# Patient Record
Sex: Female | Born: 1990 | Race: Black or African American | Hispanic: No | Marital: Single | Smoking: Never smoker
Health system: Southern US, Community
[De-identification: ages and names within clinical notes are randomized; demographics above are authoritative.]

## PROBLEM LIST (undated history)

## (undated) DIAGNOSIS — Z789 Other specified health status: Secondary | ICD-10-CM

## (undated) HISTORY — PX: PELVIC LAPAROSCOPY: SHX162

## (undated) HISTORY — PX: NO PAST SURGERIES: SHX2092

---

## 2010-02-16 ENCOUNTER — Emergency Department (HOSPITAL_COMMUNITY): Admission: EM | Admit: 2010-02-16 | Discharge: 2010-02-16 | Payer: Self-pay | Admitting: Emergency Medicine

## 2011-12-27 ENCOUNTER — Ambulatory Visit: Payer: Federal, State, Local not specified - PPO

## 2015-10-20 ENCOUNTER — Encounter (HOSPITAL_COMMUNITY): Admission: AD | Disposition: A | Discharge status: Home / Self Care | Payer: Self-pay | Source: Ambulatory Visit

## 2015-10-20 ENCOUNTER — Inpatient Hospital Stay (HOSPITAL_COMMUNITY): Payer: Federal, State, Local not specified - PPO | Admitting: Anesthesiology

## 2015-10-20 ENCOUNTER — Encounter (HOSPITAL_COMMUNITY): Payer: Self-pay | Admitting: *Deleted

## 2015-10-20 ENCOUNTER — Inpatient Hospital Stay (HOSPITAL_COMMUNITY): Payer: Federal, State, Local not specified - PPO

## 2015-10-20 ENCOUNTER — Inpatient Hospital Stay (HOSPITAL_COMMUNITY)
Admission: AD | Admit: 2015-10-20 | Discharge: 2015-10-24 | DRG: 982 | Disposition: A | Discharge status: Home / Self Care | Payer: Federal, State, Local not specified - PPO | Source: Ambulatory Visit | Attending: General Surgery | Admitting: General Surgery

## 2015-10-20 DIAGNOSIS — R102 Pelvic and perineal pain unspecified side: Secondary | ICD-10-CM

## 2015-10-20 DIAGNOSIS — R000 Tachycardia, unspecified: Secondary | ICD-10-CM | POA: Diagnosis present

## 2015-10-20 DIAGNOSIS — R1011 Right upper quadrant pain: Secondary | ICD-10-CM | POA: Diagnosis present

## 2015-10-20 DIAGNOSIS — N83201 Unspecified ovarian cyst, right side: Secondary | ICD-10-CM | POA: Diagnosis present

## 2015-10-20 DIAGNOSIS — K661 Hemoperitoneum: Secondary | ICD-10-CM | POA: Diagnosis present

## 2015-10-20 DIAGNOSIS — R188 Other ascites: Secondary | ICD-10-CM | POA: Diagnosis present

## 2015-10-20 DIAGNOSIS — R55 Syncope and collapse: Secondary | ICD-10-CM | POA: Diagnosis present

## 2015-10-20 HISTORY — PX: LAPAROSCOPY: SHX197

## 2015-10-20 HISTORY — DX: Other specified health status: Z78.9

## 2015-10-20 LAB — CBC WITH DIFFERENTIAL/PLATELET
Basophils Absolute: 0 10*3/uL (ref 0.0–0.1)
Basophils Relative: 0 %
EOS ABS: 0 10*3/uL (ref 0.0–0.7)
Eosinophils Relative: 0 %
HCT: 28.1 % — ABNORMAL LOW (ref 36.0–46.0)
HEMOGLOBIN: 9.5 g/dL — AB (ref 12.0–15.0)
LYMPHS PCT: 11 %
Lymphs Abs: 1.4 10*3/uL (ref 0.7–4.0)
MCH: 25.9 pg — AB (ref 26.0–34.0)
MCHC: 33.8 g/dL (ref 30.0–36.0)
MCV: 76.6 fL — ABNORMAL LOW (ref 78.0–100.0)
Monocytes Absolute: 0.3 10*3/uL (ref 0.1–1.0)
Monocytes Relative: 3 %
NEUTROS ABS: 10.8 10*3/uL — AB (ref 1.7–7.7)
Neutrophils Relative %: 86 %
Platelets: 273 10*3/uL (ref 150–400)
RBC: 3.67 MIL/uL — AB (ref 3.87–5.11)
RDW: 15 % (ref 11.5–15.5)
WBC: 12.6 10*3/uL — ABNORMAL HIGH (ref 4.0–10.5)

## 2015-10-20 LAB — PROTIME-INR
INR: 1.2 (ref 0.00–1.49)
Prothrombin Time: 15.4 seconds — ABNORMAL HIGH (ref 11.6–15.2)

## 2015-10-20 LAB — HEMOGLOBIN AND HEMATOCRIT, BLOOD
HEMATOCRIT: 24 % — AB (ref 36.0–46.0)
HEMOGLOBIN: 8.1 g/dL — AB (ref 12.0–15.0)

## 2015-10-20 LAB — APTT: aPTT: 28 seconds (ref 24–37)

## 2015-10-20 LAB — POCT PREGNANCY, URINE: Preg Test, Ur: NEGATIVE

## 2015-10-20 LAB — PREPARE RBC (CROSSMATCH)

## 2015-10-20 LAB — ABO/RH: ABO/RH(D): O POS

## 2015-10-20 SURGERY — LAPAROSCOPY, DIAGNOSTIC
Anesthesia: General | Site: Abdomen

## 2015-10-20 MED ORDER — FENTANYL CITRATE (PF) 100 MCG/2ML IJ SOLN
INTRAMUSCULAR | Status: DC | PRN
  Administered 2015-10-20: 50 ug via INTRAVENOUS
  Administered 2015-10-20 (×2): 100 ug via INTRAVENOUS
  Administered 2015-10-20: 50 ug via INTRAVENOUS

## 2015-10-20 MED ORDER — DEXAMETHASONE SODIUM PHOSPHATE 4 MG/ML IJ SOLN
INTRAMUSCULAR | Status: AC
  Filled 2015-10-20: qty 1

## 2015-10-20 MED ORDER — PANTOPRAZOLE SODIUM 40 MG IV SOLR
40.0000 mg | Freq: Every day | INTRAVENOUS | Status: DC
  Administered 2015-10-20 – 2015-10-22 (×3): 40 mg via INTRAVENOUS
  Filled 2015-10-20 (×3): qty 40

## 2015-10-20 MED ORDER — LACTATED RINGERS IR SOLN
Status: DC | PRN
  Administered 2015-10-20: 3000 mL

## 2015-10-20 MED ORDER — LACTATED RINGERS IV SOLN
INTRAVENOUS | Status: DC | PRN
  Administered 2015-10-20 (×3): via INTRAVENOUS

## 2015-10-20 MED ORDER — GLYCOPYRROLATE 0.2 MG/ML IJ SOLN
INTRAMUSCULAR | Status: DC | PRN
  Administered 2015-10-20: 0.6 mg via INTRAVENOUS

## 2015-10-20 MED ORDER — ROCURONIUM BROMIDE 100 MG/10ML IV SOLN
INTRAVENOUS | Status: AC
  Filled 2015-10-20: qty 1

## 2015-10-20 MED ORDER — MORPHINE SULFATE (PF) 4 MG/ML IV SOLN
2.0000 mg | INTRAVENOUS | Status: DC | PRN
  Administered 2015-10-21: 2 mg via INTRAVENOUS
  Filled 2015-10-20: qty 1

## 2015-10-20 MED ORDER — ROCURONIUM BROMIDE 100 MG/10ML IV SOLN
INTRAVENOUS | Status: DC | PRN
  Administered 2015-10-20: 10 mg via INTRAVENOUS
  Administered 2015-10-20: 30 mg via INTRAVENOUS

## 2015-10-20 MED ORDER — HYDROMORPHONE HCL 1 MG/ML IJ SOLN
INTRAMUSCULAR | Status: AC
  Filled 2015-10-20: qty 1

## 2015-10-20 MED ORDER — LIDOCAINE HCL (CARDIAC) 20 MG/ML IV SOLN
INTRAVENOUS | Status: DC | PRN
  Administered 2015-10-20: 50 mg via INTRAVENOUS

## 2015-10-20 MED ORDER — HYDROMORPHONE HCL 2 MG/ML IJ SOLN: 2.0000 mg | Freq: Once | INTRAMUSCULAR | Status: DC

## 2015-10-20 MED ORDER — HYDROMORPHONE HCL 1 MG/ML IJ SOLN
1.0000 mg | INTRAMUSCULAR | Status: DC | PRN
  Administered 2015-10-20 – 2015-10-22 (×8): 1 mg via INTRAVENOUS
  Filled 2015-10-20: qty 2
  Filled 2015-10-20 (×8): qty 1

## 2015-10-20 MED ORDER — ONDANSETRON 4 MG PO TBDP
4.0000 mg | ORAL_TABLET | Freq: Four times a day (QID) | ORAL | Status: DC | PRN
  Filled 2015-10-20: qty 1

## 2015-10-20 MED ORDER — ONDANSETRON HCL 4 MG/2ML IJ SOLN
4.0000 mg | Freq: Four times a day (QID) | INTRAMUSCULAR | Status: DC | PRN
  Administered 2015-10-21 – 2015-10-22 (×3): 4 mg via INTRAVENOUS
  Filled 2015-10-20 (×3): qty 2

## 2015-10-20 MED ORDER — BUPIVACAINE HCL (PF) 0.25 % IJ SOLN
INTRAMUSCULAR | Status: DC | PRN
  Administered 2015-10-20: 4 mL

## 2015-10-20 MED ORDER — PROPOFOL 10 MG/ML IV BOLUS
INTRAVENOUS | Status: DC | PRN
  Administered 2015-10-20: 150 mg via INTRAVENOUS

## 2015-10-20 MED ORDER — HYDROMORPHONE HCL 1 MG/ML IJ SOLN
0.2500 mg | INTRAMUSCULAR | Status: DC | PRN
  Administered 2015-10-20: 0.5 mg via INTRAVENOUS

## 2015-10-20 MED ORDER — CITRIC ACID-SODIUM CITRATE 334-500 MG/5ML PO SOLN: 30.0000 mL | Freq: Once | ORAL | Status: DC

## 2015-10-20 MED ORDER — FENTANYL CITRATE (PF) 100 MCG/2ML IJ SOLN
INTRAMUSCULAR | Status: AC
  Filled 2015-10-20: qty 2

## 2015-10-20 MED ORDER — SUCCINYLCHOLINE CHLORIDE 20 MG/ML IJ SOLN
INTRAMUSCULAR | Status: DC | PRN
  Administered 2015-10-20: 100 mg via INTRAVENOUS

## 2015-10-20 MED ORDER — KCL IN DEXTROSE-NACL 20-5-0.9 MEQ/L-%-% IV SOLN
INTRAVENOUS | Status: DC
  Administered 2015-10-20: 23:00:00 via INTRAVENOUS
  Administered 2015-10-21: 100 mL/h via INTRAVENOUS
  Administered 2015-10-21 – 2015-10-23 (×2): via INTRAVENOUS
  Administered 2015-10-24: 100 mL/h via INTRAVENOUS
  Filled 2015-10-20 (×9): qty 1000

## 2015-10-20 MED ORDER — FAMOTIDINE IN NACL 20-0.9 MG/50ML-% IV SOLN
20.0000 mg | Freq: Once | INTRAVENOUS | Status: AC
  Administered 2015-10-20: 20 mg via INTRAVENOUS
  Filled 2015-10-20: qty 50

## 2015-10-20 MED ORDER — ONDANSETRON HCL 4 MG/2ML IJ SOLN
INTRAMUSCULAR | Status: DC | PRN
  Administered 2015-10-20: 4 mg via INTRAVENOUS

## 2015-10-20 MED ORDER — FENTANYL CITRATE (PF) 250 MCG/5ML IJ SOLN
INTRAMUSCULAR | Status: AC
  Filled 2015-10-20: qty 5

## 2015-10-20 MED ORDER — SODIUM CHLORIDE 0.9 % IV BOLUS (SEPSIS)
1000.0000 mL | Freq: Once | INTRAVENOUS | Status: AC
  Administered 2015-10-20: 1000 mL via INTRAVENOUS

## 2015-10-20 MED ORDER — NEOSTIGMINE METHYLSULFATE 10 MG/10ML IV SOLN
INTRAVENOUS | Status: DC | PRN
  Administered 2015-10-20: 3 mg via INTRAVENOUS

## 2015-10-20 MED ORDER — MIDAZOLAM HCL 2 MG/2ML IJ SOLN
INTRAMUSCULAR | Status: AC
  Filled 2015-10-20: qty 2

## 2015-10-20 MED ORDER — ONDANSETRON HCL 4 MG/2ML IJ SOLN
INTRAMUSCULAR | Status: AC
  Filled 2015-10-20: qty 2

## 2015-10-20 MED ORDER — LIDOCAINE HCL (CARDIAC) 20 MG/ML IV SOLN
INTRAVENOUS | Status: AC
  Filled 2015-10-20: qty 5

## 2015-10-20 MED ORDER — PROPOFOL 10 MG/ML IV BOLUS
INTRAVENOUS | Status: AC
  Filled 2015-10-20: qty 20

## 2015-10-20 MED ORDER — MIDAZOLAM HCL 2 MG/2ML IJ SOLN
INTRAMUSCULAR | Status: DC | PRN
  Administered 2015-10-20: 2 mg via INTRAVENOUS

## 2015-10-20 MED ORDER — DEXAMETHASONE SODIUM PHOSPHATE 10 MG/ML IJ SOLN
INTRAMUSCULAR | Status: DC | PRN
  Administered 2015-10-20: 4 mg via INTRAVENOUS

## 2015-10-20 MED ORDER — SUCCINYLCHOLINE CHLORIDE 20 MG/ML IJ SOLN
INTRAMUSCULAR | Status: AC
  Filled 2015-10-20: qty 1

## 2015-10-20 SURGICAL SUPPLY — 29 items
CABLE HIGH FREQUENCY MONO STRZ (ELECTRODE) IMPLANT
CANISTER SUCT 3000ML (MISCELLANEOUS) ×2 IMPLANT
CHLORAPREP W/TINT 26ML (MISCELLANEOUS) IMPLANT
CLOTH BEACON ORANGE TIMEOUT ST (SAFETY) ×2 IMPLANT
DISSECTOR BLUNT TIP ENDO 5MM (MISCELLANEOUS) IMPLANT
DRSG COVADERM PLUS 2X2 (GAUZE/BANDAGES/DRESSINGS) ×2 IMPLANT
DRSG OPSITE POSTOP 3X4 (GAUZE/BANDAGES/DRESSINGS) IMPLANT
DURAPREP 26ML APPLICATOR (WOUND CARE) ×2 IMPLANT
GLOVE BIO SURGEON STRL SZ 6.5 (GLOVE) ×4 IMPLANT
GLOVE BIOGEL PI IND STRL 7.0 (GLOVE) ×2 IMPLANT
GLOVE BIOGEL PI INDICATOR 7.0 (GLOVE) ×2
GOWN STRL REUS W/TWL LRG LVL3 (GOWN DISPOSABLE) ×10 IMPLANT
LIQUID BAND (GAUZE/BANDAGES/DRESSINGS) ×2 IMPLANT
NEEDLE INSUFFLATION 120MM (ENDOMECHANICALS) ×2 IMPLANT
NS IRRIG 1000ML POUR BTL (IV SOLUTION) ×2 IMPLANT
PACK LAPAROSCOPY BASIN (CUSTOM PROCEDURE TRAY) ×2 IMPLANT
PAD TRENDELENBURG POSITION (MISCELLANEOUS) ×2 IMPLANT
SCISSORS LAP 5X35 DISP (ENDOMECHANICALS) IMPLANT
SET CYSTO W/LG BORE CLAMP LF (SET/KITS/TRAYS/PACK) IMPLANT
SET IRRIG TUBING LAPAROSCOPIC (IRRIGATION / IRRIGATOR) ×2 IMPLANT
SHEARS HARMONIC ACE PLUS 36CM (ENDOMECHANICALS) IMPLANT
SLEEVE XCEL OPT CAN 5 100 (ENDOMECHANICALS) ×2 IMPLANT
SUT VICRYL 4-0 PS2 18IN ABS (SUTURE) ×2 IMPLANT
SYSTEM CARTER THOMASON II (TROCAR) IMPLANT
TOWEL OR 17X24 6PK STRL BLUE (TOWEL DISPOSABLE) ×4 IMPLANT
TRAY FOLEY CATH SILVER 14FR (SET/KITS/TRAYS/PACK) ×2 IMPLANT
TROCAR XCEL DIL TIP R 11M (ENDOMECHANICALS) IMPLANT
TROCAR XCEL NON-BLD 5MMX100MML (ENDOMECHANICALS) ×2 IMPLANT
WARMER LAPAROSCOPE (MISCELLANEOUS) ×2 IMPLANT

## 2015-10-20 NOTE — Anesthesia Postprocedure Evaluation (Signed)
Anesthesia Post Note  Patient: Kathryn [name]  Procedure(s) Performed: Procedure(s) (LRB): LAPAROSCOPY DIAGNOSTIC, DRAINAGE OF RIGHT OVARIAN CYST (N/A)  Patient location during evaluation: PACU Anesthesia Type: General Level of consciousness: awake and alert Pain management: pain level controlled Vital Signs Assessment: post-procedure vital signs reviewed and stable Respiratory status: spontaneous breathing, nonlabored ventilation, respiratory function stable and patient connected to nasal cannula oxygen Cardiovascular status: blood pressure returned to baseline and stable Postop Assessment: no signs of nausea or vomiting Anesthetic complications: no    Last Vitals:  Filed Vitals:   10/20/15 2045 10/20/15 2100  BP: 106/68 102/76  Pulse: 83 92  Temp:    Resp: 14 13    Last Pain:  Filed Vitals:   10/20/15 2108  PainSc: 1                  Phillips Groutarignan, Renie Stelmach

## 2015-10-20 NOTE — Op Note (Signed)
Preoperative Diagnosis: Abdominal pain, hemoperitoneum, right ovarian cyst  Postoperative Diagnosis: same  Procedure: Diagnostic laparoscopy, drainage of right ovarian cyst, removal of hemoperitoneum  Surgeon: Dr Gertie ExonJill Jertson  Assistant: Dr Leda QuailSuzanne Miller  Intraoperative Consultation: Dr Johna SheriffHoxworth  Anesthesia: General  EBL: 900 cc   Fluids: 1,000 cc LR  Urine output: 300 cc  Complications: none  Indications for surgery: The patient is a 25 year old female, who presented with abdominal pain. Work up included a RUQ ultrasound that showed fluid around her liver, pelvic ultrasound showed a 4+ cm simple right ovarian cyst, complex fluid in her pelvis, hgb of 9.5. The patient was tachycardic, stable BP. Negative pregnancy test. The patient is aware of the risks and complications involved with the surgery and consent was obtained prior to the procedure.  Findings: Normal uterus, normal tubes, normal left ovary, right ovary with 4+ cm simple cyst. Normal appendix, normal liver edge, normal spleen. No source of bleeding was found.   Procedure: The patient was taken to the operating room with an IV in place. She was placed in the dorsal lithotomy position. General anesthesia was administered. She was prepped and draped in the usual sterile fashion for an abdominal, vaginal surgery. A hulka tenaculum was placed in the uterus and a foley catheter was placed in her bladder.  The umbilicus was everted, injected with 0.25% marcaine and incised with a # 11 blade. 2 towel clips were used to elevated the umbilicus and a veress needle was placed into the abdominal cavity. The abdominal cavity was insufflated with CO2, with normal intraabdominal pressures. After adequate pneumo-insufflation the veress needle was removed and the 5 mm laparoscope was placed into the abdominal cavity using the opti-view trocar. Blood was noted filling the pelvis and the upper abdomen. The patient was placed in trendelenburg and  the abdominal pelvic cavity was inspected. 1 more trocars were placed in the right lower quadrant approximately 3 cm medial to the anterior superior iliac spine. This area was injected with 0.25% marcaine, incised with a #11 blade and a 5 mm trocars was inserted with direct visualization with the laparoscope.  The blood was suctioned out of the abdomen and pelvis, the clot was broken up and removed. The abdominal pelvic cavity was carefully inspected. No clear source of bleeding was found. The right ovarian cyst was drained of clear fluid. The edge of the ovary bleed slightly after being drained and was cauterized. Hemostasis was excellent.  After suctioning the blood it did appear to well up slightly in the upper abdomen. An intraoperative consult was obtained with Dr Johna SheriffHoxworth in General Surgery. Please see his separate operative report.   After Dr Johna SheriffHoxworth was done with his portion of the surgery the abdomen was desulfated and the trocars were removed.   The skin was closed with dermabond. The hulka tenaculum was removed. The foley was left in place.   The patient's abdomen and perineum were cleansed and she was taken out of the dorsal lithotomy position. Upon awakening she was extubated and taken to the recovery room in stable condition. The sponge and instrument counts were correct.   [facility] for further evaluation and observation.

## 2015-10-20 NOTE — Transfer of Care (Signed)
Immediate Anesthesia Transfer of Care Note  Patient: Kathryn [name]  Procedure(s) Performed: Procedure(s): LAPAROSCOPY DIAGNOSTIC, DRAINAGE OF RIGHT OVARIAN CYST (N/A)  Patient Location: PACU  Anesthesia Type:General  Level of Consciousness: awake, alert , oriented and patient cooperative  Airway & Oxygen Therapy: Patient Spontanous Breathing and Patient connected to nasal cannula oxygen  Post-op Assessment: Report given to RN and Post -op Vital signs reviewed and stable  Post vital signs: Reviewed and stable  Last Vitals: TEMP 99.5 BP 112/77 HR 77 RR 12 POX 100   Complications: No apparent anesthesia complications

## 2015-10-20 NOTE — MAU Note (Signed)
States increased pain to right mid. Abdomen this am around 0500 causing to pass out.  Presents with increased abd,. Pain.  Upon inspection in the lobby, pt. Appeared that she was passing out.  Amonina used and pt. Responded and escorted to room via wheel chair.

## 2015-10-20 NOTE — MAU Note (Signed)
URINE IN LAB 

## 2015-10-20 NOTE — H&P (Signed)
GYNECOLOGY  VISIT   HPI: 25 y.o.   Single  Mediterranean  female   G0P0000 with Patient's last menstrual period was 09/21/2015.   The patient c/o BLQ abdominal pain that started at 1 am and progressively worsened. Over several hours the pain went up to her RUQ. The pain is constant, a "20/10" in severity. S/P Dilaudid the pain is a "3/10" in severity. She presented to Dr Yetta BarreJones this morning c/o RUQ pain. She had blood work and a RUQ ultrasound. She was noted to have intraabdominal fluid in BLQ and RUQ. The patient was sent here for further evaluation. She c/o syncope x 1 this morning. No fevers, nausea, emesis, diarrhea, or urinary c/o.   GYNECOLOGIC HISTORY: Patient's last menstrual period was 09/21/2015. Contraception: condoms Menopausal hormone therapy: NA        OB History    Gravida Para Term Preterm AB TAB SAB Ectopic Multiple Living   0 0 0 0 0 0 0 0 0 0          There are no active problems to display for this patient.   Past Medical History  Diagnosis Date  . Medical history non-contributory     Past Surgical History  Procedure Laterality Date  . No past surgeries      Current Facility-Administered Medications  Medication Dose Route Frequency Provider Last Rate Last Dose  . HYDROmorphone (DILAUDID) injection 1-2 mg  1-2 mg Intravenous Q3H PRN Dorathy KinsmanVirginia Smith, CNM   1 mg at 10/20/15 1532     ALLERGIES: Review of patient's allergies indicates no known allergies.  History reviewed. No pertinent family history.  Social History   Social History  . Marital Status: Single    Spouse Name: N/A  . Number of Children: N/A  . Years of Education: N/A   Occupational History  . Not on file.   Social History Main Topics  . Smoking status: Never Smoker   . Smokeless tobacco: Never Used  . Alcohol Use: No  . Drug Use: No  . Sexual Activity: Yes    Birth Control/ Protection: Condom   Other Topics Concern  . Not on file   Social History Narrative  . No narrative on  file    Review of Systems  HENT: Negative.   Eyes: Negative.   Respiratory: Negative.   Cardiovascular: Negative.   Gastrointestinal: Positive for vomiting and abdominal pain. Negative for heartburn, nausea, diarrhea and constipation.  Genitourinary: Negative.   Musculoskeletal: Negative.   Skin: Negative.   Neurological: Positive for weakness.  Endo/Heme/Allergies: Negative.   Psychiatric/Behavioral: Negative.       PHYSICAL EXAMINATION:    BP 114/70 mmHg  Pulse 116  Temp(Src) 97.7 F (36.5 C) (Oral)  Resp 20  Ht 5' 0.5" (1.537 m)  Wt 114 lb 12.8 oz (52.073 kg)  BMI 22.04 kg/m2  SpO2 100%  LMP 09/21/2015     General appearance: alert, cooperative and appears stated age Neck: no adenopathy, supple, symmetrical, trachea midline and thyroid  Heart: tachycardic, regular rhythm, no murmur Lungs: CTAB Abdomen: soft, tender RLQ, no rebound, no guarding, distended. Extremities: normal, atraumatic, no cyanosis Skin: normal color, texture and turgor, no rashes or lesions Neurologic: grossly normal  CBC    Component Value Date/Time   WBC 12.6* 10/20/2015 1417   RBC 3.67* 10/20/2015 1417   HGB 9.5* 10/20/2015 1417   HCT 28.1* 10/20/2015 1417   PLT 273 10/20/2015 1417   MCV 76.6* 10/20/2015 1417   MCH 25.9* 10/20/2015 1417  MCHC 33.8 10/20/2015 1417   RDW 15.0 10/20/2015 1417   LYMPHSABS 1.4 10/20/2015 1417   MONOABS 0.3 10/20/2015 1417   EOSABS 0.0 10/20/2015 1417   BASOSABS 0.0 10/20/2015 1417   upt negative  EXAM: TRANSABDOMINAL AND TRANSVAGINAL ULTRASOUND OF PELVIS  DOPPLER ULTRASOUND OF OVARIES  TECHNIQUE: Both transabdominal and transvaginal ultrasound examinations of the pelvis were performed. Transabdominal technique was performed for global imaging of the pelvis including uterus, ovaries, adnexal regions, and pelvic cul-de-sac.  It was necessary to proceed with endovaginal exam following the transabdominal exam to visualize the ovaries. Color  and duplex Doppler ultrasound was utilized to evaluate blood flow to the ovaries.  COMPARISON: None.  FINDINGS: Uterus  Measurements: 9.5 x 6.3 x 4.7 cm. No fibroids or other mass visualized.  Endometrium  Thickness: 15 mm which is within normal limits for patient of reproductive age. No focal abnormality visualized.  Right ovary  Measurements: 6.9 x 5.7 x 5.1 cm. 4.5 cm simple cyst is noted.  Left ovary  Measurements: 4.1 x 1.9 x 1.8 cm. Normal appearance/no adnexal mass.  Pulsed Doppler evaluation of both ovaries demonstrates normal low-resistance arterial and venous waveforms.  Other findings  There is noted a moderate amount of complex free fluid in the pelvis, particularly in the right adnexal region. This is concerning for possible hemorrhage.  IMPRESSION: 4.5 cm simple right ovarian cyst is noted. There is noted a moderate amount of complex free fluid seen in the pelvis, particularly on the right adnexal region which may represent possible hemorrhage. CT scan may be performed for further evaluation. Critical Value/emergent results were called by telephone at the time of interpretation on 10/20/2015 at 4:03 pm to Dr. Dorathy Kinsman , who verbally acknowledged these results.   Electronically Signed  By: Roque Lias, MD      ASSESSMENT Abdominal pain, blood in in abdomen, simple right ovarian cyst no evidence of torsion. Hgb of 9.5, negative UPT    PLAN Discussed with the patient admission and observation vs laparoscopy Will check orthostatics now 120/78   BP- Lying                                  Pulse- Lying                             108   Pulse- Lying    BP- Sitting                             105/70   BP- Sitting    Pulse- Sitting                             112   Pulse- Sitting    BP- Standing at 0 minutes                             111/67   BP- Standing at 0 minutes    Pulse- Standing at 0 minutes                              130   Pulse- Standing at 0 minutes    BP- Standing at 3 minutes  114/72   BP- Standing at 3 minutes    Pulse- Standing at 3 minutes                             120   Pulse- Standing at 3 minutes    Oxygenation       After further discussion with the patient and family, will proceed with diagnostic laparoscopy. Risks reviewed, including: bleeding, infection, damage to bowel, bladder, vessels, ureters.    An After Visit Summary was printed and given to the patient.

## 2015-10-20 NOTE — MAU Provider Note (Signed)
Chief Complaint: Abdominal Pain   First Provider Initiated Contact with Patient 10/20/15 1418     SUBJECTIVE HPI: Kathryn [name] is a 25 y.o. G0P0000 non-pregnant female who presents to [facility] reporting severe low abd and epigastric pain since this morning. Pain was so severe that she passed out this morning for 10-15 seconds. Denies any trauma from this episiode. Had a near-syncopal episode in lobby of MAU today from a wave of pain.    Was seen at Urgent Care this morning for the pain. Abd US results below showed 4.3 cm right ovarian cyst and significant free fluid. UPT neg and CBC collected per report to pt's Gyn, Dr, Oscar LaJertson. Unable to see those result is Care Everywhere. Instructed to come to MAU for further eval.   TECHNIQUE: Real-time, multiplanar, grayscale, color Doppler ultrasound of the right upper quadrant was performed.  INDICATION: Upper quadrant pain  COMPARISON: None.  FINDINGS:  Aorta/IVC: The proximal aorta measures 1.6 cm, the mid aorta measures 1.6 cm, the distal aorta measures 1.4 cm. Normal caliber IVC.  Liver: Normal in echotexture without focal lesion or biliary ductal dilatation. The main portal vein is patent by color Doppler analysis with normal directional flow.  Gallbladder: No stones or sludge. No gallbladder wall thickening or pericholecystic fluid. Mild tenderness to palpation with the probe. The CBD measures 3 mm.  Pancreas: Mostly obscured by overlying bowel gas. The visualized portion is unremarkable.  Right kidney: The right kidney measures 11.3 cm. No solid mass, pelvocaliectasis or stones.  Incidental ascites in Morisons pouch, right side and bilateral lower quadrants. Subtle right ovarian cyst measuring 4.3 cm  IMPRESSION:   Bilateral lower quadrant and right abdominal ascites. Right ovarian cyst. Right upper quadrant tenderness during exam.  Electronically Signed by Samuel JesterLeslie Fort, MD   Location: Suprapubic, epigastric Quality:  sharp Severity: 7/10 on pain scale Duration: 10 hours Context: None Course: unchanged Timing: Intermittent Modifying factors: No improvement w/ Ibuprofen Associated signs and symptoms: Positive for syncopal episode. Negative for fever, chills, nausea, vomiting, diarrhea, constipation, urinary complaints.  Past Medical History  Diagnosis Date  . Medical history non-contributory    OB History  Gravida Para Term Preterm AB SAB TAB Ectopic Multiple Living  0 0 0 0 0 0 0 0 0 0        Past Surgical History  Procedure Laterality Date  . No past surgeries     Social History   Social History  . Marital Status: Single    Spouse Name: N/A  . Number of Children: N/A  . Years of Education: N/A   Occupational History  . Not on file.   Social History Main Topics  . Smoking status: Never Smoker   . Smokeless tobacco: Never Used  . Alcohol Use: No  . Drug Use: No  . Sexual Activity: Yes    Birth Control/ Protection: Condom   Other Topics Concern  . Not on file   Social History Narrative  . No narrative on file   No current facility-administered medications on file prior to encounter.   No current outpatient prescriptions on file prior to encounter.   No Known Allergies  I have reviewed the past Medical Hx, Surgical Hx, Social Hx, Allergies and Medications.   Review of Systems  Constitutional: Negative for fever, chills and appetite change.  Gastrointestinal: Positive for abdominal pain. Negative for nausea, vomiting, diarrhea, constipation, blood in stool and abdominal distention.  Genitourinary: Negative for dysuria, urgency, frequency, hematuria, flank pain, vaginal bleeding and  vaginal discharge.  Musculoskeletal: Negative for back pain.  Skin: Negative for pallor.  Neurological: Positive for dizziness and syncope. Negative for headaches.    OBJECTIVE Patient Vitals for the past 24 hrs:  BP Temp Temp src Pulse Resp SpO2 Height Weight  10/20/15 1700 107/63 mmHg - -  98 - - - -  10/20/15 1630 114/70 mmHg - - 116 - - - -  10/20/15 1615 (!) 93/49 mmHg - - 104 - - - -  10/20/15 1600 (!) 93/50 mmHg - - 96 - - - -  10/20/15 1546 93/58 mmHg - - 108 - - - -  10/20/15 1537 111/68 mmHg - - 97 - 100 % - -  10/20/15 1412 97/59 mmHg 97.7 F (36.5 C) Oral 79 20 97 % - -  10/20/15 1327 117/71 mmHg 99.7 F (37.6 C) Oral 108 18 100 % 5' 0.5" (1.537 m) 114 lb 12.8 oz (52.073 kg)   Constitutional: Well-developed, well-nourished female in mild-moderate distress. Mild pallor. Pt standign w/ assistance when CNM arrived in room. States dizziness was better.  Cardiovascular: Mild tachycardia Respiratory: normal rate and effort.  GI: Abd mildly-distended, severe suprapubic tenderness and moderate epigastric tenderness. Pos BS x 4 Neurologic: Alert and oriented x 4.  GU: Deferred  LAB RESULTS Results for orders placed or performed during the hospital encounter of 10/20/15 (from the past 24 hour(s))  CBC with Differential/Platelet     Status: Abnormal   Collection Time: 10/20/15  2:17 PM  Result Value Ref Range   WBC 12.6 (H) 4.0 - 10.5 K/uL   RBC 3.67 (L) 3.87 - 5.11 MIL/uL   Hemoglobin 9.5 (L) 12.0 - 15.0 g/dL   HCT 16.1 (L) 09.6 - 04.5 %   MCV 76.6 (L) 78.0 - 100.0 fL   MCH 25.9 (L) 26.0 - 34.0 pg   MCHC 33.8 30.0 - 36.0 g/dL   RDW 40.9 81.1 - 91.4 %   Platelets 273 150 - 400 K/uL   Neutrophils Relative % 86 %   Neutro Abs 10.8 (H) 1.7 - 7.7 K/uL   Lymphocytes Relative 11 %   Lymphs Abs 1.4 0.7 - 4.0 K/uL   Monocytes Relative 3 %   Monocytes Absolute 0.3 0.1 - 1.0 K/uL   Eosinophils Relative 0 %   Eosinophils Absolute 0.0 0.0 - 0.7 K/uL   Basophils Relative 0 %   Basophils Absolute 0.0 0.0 - 0.1 K/uL    IMAGING No results found.  MAU COURSE CBC, type and screen, pelvic and transvaginal ultrasound with Dopplers, nothing by mouth, saline bolus.  Brief discussion of history and results from Urgent Care this morning with Dr. Oscar La. Agrees with plan of  care.  Dr. Oscar La notified of ultrasound results. Still attempting to obtain results from urgent care. Verbally told that hemoglobin was 10.0 at visit this morning.   Care of patient turned over to Dr. Oscar La.  MDM - 25 year old non-pregnant female with right ovarian cyst and moderate hemoperitoneum of unknown cause. Concern for hemodynamic instability due to worsening tachycardia and syncopal episodes.  ASSESSMENT 1. Ovarian cyst, right   2. Acute pelvic pain, female   3. Hemoperitoneum    PLAN See Dr. Salli Quarry note.  [place], [place] 10/20/2015  5:28 PM

## 2015-10-20 NOTE — Anesthesia Procedure Notes (Signed)
Procedure Name: Intubation Date/Time: 10/20/2015 6:11 PM Performed by: Shanon PayorGREGORY, Johnthomas Lader M Pre-anesthesia Checklist: Patient identified, Emergency Drugs available, Suction available, Patient being monitored and Timeout performed Patient Re-evaluated:Patient Re-evaluated prior to inductionOxygen Delivery Method: Circle system utilized Preoxygenation: Pre-oxygenation with 100% oxygen Intubation Type: IV induction, Rapid sequence and Cricoid Pressure applied Laryngoscope Size: Mac and 3 Grade View: Grade I Tube type: Oral Tube size: 7.0 mm Number of attempts: 1 Airway Equipment and Method: Stylet Placement Confirmation: ETT inserted through vocal cords under direct vision,  positive ETCO2 and breath sounds checked- equal and bilateral Secured at: 19 cm Tube secured with: Tape Dental Injury: Teeth and Oropharynx as per pre-operative assessment

## 2015-10-20 NOTE — Consult Note (Signed)
History: I was contacted by Dr. Oscar LaJertson intraoperatively regarding this patient. She was admitted today with the following history:  The patient c/o BLQ abdominal pain that started at 1 am and progressively worsened. Over several hours the pain went up to her RUQ. The pain is constant, a "20/10" in severity. S/P Dilaudid the pain is a "3/10" in severity. She presented to Dr Yetta BarreJones this morning c/o RUQ pain. She had blood work and a RUQ ultrasound. She was noted to have intraabdominal fluid in BLQ and RUQ. The patient was sent here for further evaluation. She c/o syncope x 1 this morning. No fevers, nausea, emesis, diarrhea, or urinary c/o.   Right upper quadrant and pelvic ultrasound showed fluid in the abdomen and a probable ovarian cyst and she was taken to the operating room for laparoscopy with diagnosis of probable bleeding ruptured ovarian cyst. At the time of laparoscopy there did not appear to be active bleeding from the cyst or any evidence of rupture or previous bleeding from the cyst and the majority of the blood was in the upper abdomen. This was all suctioned and she had a total of 900 mL's of blood in the peritoneal cavity. There is no apparent history of trauma. I was asked to evaluate the patient intraoperatively. Laparoscopically the liver and spleen appeared normal. There was no evidence of retroperitoneal hematoma. Bowel loops and omentum appeared normal. Certainly no evidence of ongoing bleeding in the pelvis. There was a little bit of blood accumulating in the upper abdomen beneath both hemidiaphragms but consistent with just pulling rather than ongoing bleeding. She has been stable hemodynamically.  Physical exam not able to be performed at this time as above  Assessment and plan: Spontaneous intraperitoneal bleed a significant volume, 900 mL. No obvious source seen at laparoscopy and its the opinion of Dr. Oscar LaJertson that operative findings indicated it is highly unlikely that this was a  pelvic bleeding source. Liver appeared normal and right upper quadrant ultrasound so hemangioma or ruptured adenoma seemed very unlikely. Question trivial trauma to the spleen or congenital aneurysm but no evidence of ongoing bleeding or signs of this laparoscopically.  The patient appears stable but there is concern regarding repeat bleeding over the source of the bleeding. I therefore offered to take the [facility]. I discussed with radiology we will obtain a CT scan of the abdomen and pelvis with contrast as an initial screening. Observe closely in the ICU for now. She currently is very stable.

## 2015-10-20 NOTE — MAU Note (Signed)
Started with body aches, woke her then started having severe pain in RUQ down to RLQ and up to rt shoulder, having pain on left side but not as bad.

## 2015-10-20 NOTE — Anesthesia Preprocedure Evaluation (Addendum)
Anesthesia Evaluation    Reviewed: Allergy & Precautions, NPO status , Patient's Chart, lab work & pertinent test results  History of Anesthesia Complications Negative for: history of anesthetic complications  Airway Mallampati: II  TM Distance: >3 FB Neck ROM: Full    Dental no notable dental hx.    Pulmonary neg pulmonary ROS,    Pulmonary exam normal breath sounds clear to auscultation       Cardiovascular negative cardio ROS Normal cardiovascular exam Rhythm:Regular Rate:Normal     Neuro/Psych negative neurological ROS  negative psych ROS   GI/Hepatic negative GI ROS, Neg liver ROS,   Endo/Other  negative endocrine ROS  Renal/GU negative Renal ROS  negative genitourinary   Musculoskeletal negative musculoskeletal ROS (+)   Abdominal   Peds negative pediatric ROS (+)  Hematology  (+) anemia ,   Anesthesia Other Findings   Reproductive/Obstetrics negative OB ROS                           Anesthesia Physical Anesthesia Plan  ASA: III and emergent  Anesthesia Plan: General   Post-op Pain Management:    Induction: Intravenous, Rapid sequence and Cricoid pressure planned  Airway Management Planned: Oral ETT  Additional Equipment:   Intra-op Plan:   Post-operative Plan: Extubation in OR  Informed Consent:   Plan Discussed with:   Anesthesia Plan Comments:        Anesthesia Quick Evaluation

## 2015-10-21 ENCOUNTER — Telehealth: Payer: Self-pay | Admitting: Obstetrics and Gynecology

## 2015-10-21 ENCOUNTER — Encounter (HOSPITAL_COMMUNITY): Payer: Self-pay

## 2015-10-21 ENCOUNTER — Inpatient Hospital Stay (HOSPITAL_COMMUNITY): Payer: Federal, State, Local not specified - PPO

## 2015-10-21 LAB — CBC WITH DIFFERENTIAL/PLATELET
BASOS ABS: 0 10*3/uL (ref 0.0–0.1)
Basophils Relative: 0 %
Eosinophils Absolute: 0.1 10*3/uL (ref 0.0–0.7)
Eosinophils Relative: 1 %
HCT: 24.1 % — ABNORMAL LOW (ref 36.0–46.0)
HEMOGLOBIN: 8 g/dL — AB (ref 12.0–15.0)
LYMPHS ABS: 2 10*3/uL (ref 0.7–4.0)
LYMPHS PCT: 22 %
MCH: 26 pg (ref 26.0–34.0)
MCHC: 33.2 g/dL (ref 30.0–36.0)
MCV: 78.2 fL (ref 78.0–100.0)
Monocytes Absolute: 0.6 10*3/uL (ref 0.1–1.0)
Monocytes Relative: 7 %
NEUTROS PCT: 70 %
Neutro Abs: 6.4 10*3/uL (ref 1.7–7.7)
PLATELETS: 234 10*3/uL (ref 150–400)
RBC: 3.08 MIL/uL — AB (ref 3.87–5.11)
RDW: 15.4 % (ref 11.5–15.5)
WBC: 9 10*3/uL (ref 4.0–10.5)

## 2015-10-21 LAB — COMPREHENSIVE METABOLIC PANEL
ALK PHOS: 40 U/L (ref 38–126)
ALT: 12 U/L — AB (ref 14–54)
AST: 16 U/L (ref 15–41)
Albumin: 3.2 g/dL — ABNORMAL LOW (ref 3.5–5.0)
Anion gap: 6 (ref 5–15)
BILIRUBIN TOTAL: 0.6 mg/dL (ref 0.3–1.2)
CALCIUM: 8 mg/dL — AB (ref 8.9–10.3)
CO2: 21 mmol/L — ABNORMAL LOW (ref 22–32)
CREATININE: 0.49 mg/dL (ref 0.44–1.00)
Chloride: 113 mmol/L — ABNORMAL HIGH (ref 101–111)
GFR calc Af Amer: >60 mL/min (ref >60)
Glucose, Bld: 115 mg/dL — ABNORMAL HIGH (ref 65–99)
Potassium: 3.6 mmol/L (ref 3.5–5.1)
Sodium: 140 mmol/L (ref 135–145)
Total Protein: 6.5 g/dL (ref 6.5–8.1)

## 2015-10-21 LAB — TYPE AND SCREEN
ABO/RH(D): O POS
Antibody Screen: NEGATIVE

## 2015-10-21 LAB — PROTIME-INR
INR: 1.27 (ref 0.00–1.49)
Prothrombin Time: 15.6 seconds — ABNORMAL HIGH (ref 11.6–15.2)

## 2015-10-21 LAB — CBC
HEMATOCRIT: 24.1 % — AB (ref 36.0–46.0)
HEMOGLOBIN: 8.1 g/dL — AB (ref 12.0–15.0)
MCH: 25.5 pg — ABNORMAL LOW (ref 26.0–34.0)
MCHC: 33.6 g/dL (ref 30.0–36.0)
MCV: 75.8 fL — AB (ref 78.0–100.0)
Platelets: 239 10*3/uL (ref 150–400)
RBC: 3.18 MIL/uL — AB (ref 3.87–5.11)
RDW: 15.1 % (ref 11.5–15.5)
WBC: 9.6 10*3/uL (ref 4.0–10.5)

## 2015-10-21 LAB — HEMOGLOBIN AND HEMATOCRIT, BLOOD
HCT: 23.4 % — ABNORMAL LOW (ref 36.0–46.0)
HEMATOCRIT: 23.5 % — AB (ref 36.0–46.0)
Hemoglobin: 7.9 g/dL — ABNORMAL LOW (ref 12.0–15.0)
Hemoglobin: 8 g/dL — ABNORMAL LOW (ref 12.0–15.0)

## 2015-10-21 LAB — APTT: APTT: 28 s (ref 24–37)

## 2015-10-21 LAB — ABO/RH: ABO/RH(D): O POS

## 2015-10-21 LAB — MRSA PCR SCREENING: MRSA by PCR: NEGATIVE

## 2015-10-21 MED ORDER — IOPAMIDOL (ISOVUE-300) INJECTION 61%
100.0000 mL | Freq: Once | INTRAVENOUS | Status: AC | PRN
  Administered 2015-10-21: 100 mL via INTRAVENOUS

## 2015-10-21 NOTE — Progress Notes (Signed)
1 Day Post-Op  Subjective: She looks fine.  Abdomen is tight, and very sore but she does not seem to have an acute peritonitis.    Objective: Vital signs in last 24 hours: Temp:  [97.7 F (36.5 C)-100.9 F (38.3 C)] 98.3 F (36.8 C) (04/19 0800) Pulse Rate:  [71-116] 114 (04/19 0800) Resp:  [11-25] 20 (04/19 0800) BP: (93-122)/(48-83) 117/77 mmHg (04/19 0800) SpO2:  [97 %-100 %] 99 % (04/19 0800) Weight:  [52.073 kg (114 lb 12.8 oz)-55.3 kg (121 lb 14.6 oz)] 55.3 kg (121 lb 14.6 oz) (04/18 2245) Last BM Date: 10/20/15 900 blood recorded from surgery Urine 2125 IV fluids 2651 Tm 100.9 better this AM BP low to normal 90's - 117 range HR OK H/H is pending, none done since 2010, some issue with IT; ordered q6h CT scan early this AM:  Expected appearance of the abdomen and pelvis post laparoscopy. Source of hemoperitoneum is not identified. No evidence of active bleeding or a current hemoperitoneum. Intake/Output from previous day: 04/18 0701 - 04/19 0700 In: 2651.7 [I.V.:2651.7] Out: 3025 [Urine:2125; Blood:900] Intake/Output this shift: Total I/O In: 100 [I.V.:100] Out: -   General appearance: alert, cooperative, no distress and sore Resp: clear to auscultation bilaterally GI: tight, sore, port sites look fine.  BS absent.  Lab Results:   Recent Labs  10/20/15 1417 10/20/15 2010  WBC 12.6*  --   HGB 9.5* 8.1*  HCT 28.1* 24.0*  PLT 273  --     BMET No results for input(s): NA, K, CL, CO2, GLUCOSE, BUN, CREATININE, CALCIUM in the last 72 hours. PT/INR  Recent Labs  10/20/15 2010  LABPROT 15.4*  INR 1.20    No results for input(s): AST, ALT, ALKPHOS, BILITOT, PROT, ALBUMIN in the last 168 hours.   Lipase  No results found for: LIPASE   Studies/Results: [place] Transvaginal Non-ob  10/20/2015  CLINICAL DATA:  Acute right lower quadrant abdominal pain. EXAM: TRANSABDOMINAL AND TRANSVAGINAL ULTRASOUND OF PELVIS DOPPLER ULTRASOUND OF OVARIES TECHNIQUE: Both  transabdominal and transvaginal ultrasound examinations of the pelvis were performed. Transabdominal technique was performed for global imaging of the pelvis including uterus, ovaries, adnexal regions, and pelvic cul-de-sac. It was necessary to proceed with endovaginal exam following the transabdominal exam to visualize the ovaries. Color and duplex Doppler ultrasound was utilized to evaluate blood flow to the ovaries. COMPARISON:  None. FINDINGS: Uterus Measurements: 9.5 x 6.3 x 4.7 cm. No fibroids or other mass visualized. Endometrium Thickness: 15 mm which is within normal limits for patient of reproductive age. No focal abnormality visualized. Right ovary Measurements: 6.9 x 5.7 x 5.1 cm. 4.5 cm simple cyst is noted. Left ovary Measurements: 4.1 x 1.9 x 1.8 cm. Normal appearance/no adnexal mass. Pulsed Doppler evaluation of both ovaries demonstrates normal low-resistance arterial and venous waveforms. Other findings There is noted a moderate amount of complex free fluid in the pelvis, particularly in the right adnexal region. This is concerning for possible hemorrhage. IMPRESSION: 4.5 cm simple right ovarian cyst is noted. There is noted a moderate amount of complex free fluid seen in the pelvis, particularly on the right adnexal region which may represent possible hemorrhage. CT scan may be performed for further evaluation. Critical Value/emergent results were called by telephone at the time of interpretation on 10/20/2015 at 4:03 pm to Dr. Dorathy KinsmanVIRGINIA SMITH , who verbally acknowledged these results. Electronically Signed   By: Lupita RaiderJames  Green Jr, M.D.   On: 10/20/2015 16:03   [place] Pelvis Complete  10/20/2015  CLINICAL DATA:  Acute right lower quadrant abdominal pain. EXAM: TRANSABDOMINAL AND TRANSVAGINAL ULTRASOUND OF PELVIS DOPPLER ULTRASOUND OF OVARIES TECHNIQUE: Both transabdominal and transvaginal ultrasound examinations of the pelvis were performed. Transabdominal technique was performed for global imaging of  the pelvis including uterus, ovaries, adnexal regions, and pelvic cul-de-sac. It was necessary to proceed with endovaginal exam following the transabdominal exam to visualize the ovaries. Color and duplex Doppler ultrasound was utilized to evaluate blood flow to the ovaries. COMPARISON:  None. FINDINGS: Uterus Measurements: 9.5 x 6.3 x 4.7 cm. No fibroids or other mass visualized. Endometrium Thickness: 15 mm which is within normal limits for patient of reproductive age. No focal abnormality visualized. Right ovary Measurements: 6.9 x 5.7 x 5.1 cm. 4.5 cm simple cyst is noted. Left ovary Measurements: 4.1 x 1.9 x 1.8 cm. Normal appearance/no adnexal mass. Pulsed Doppler evaluation of both ovaries demonstrates normal low-resistance arterial and venous waveforms. Other findings There is noted a moderate amount of complex free fluid in the pelvis, particularly in the right adnexal region. This is concerning for possible hemorrhage. IMPRESSION: 4.5 cm simple right ovarian cyst is noted. There is noted a moderate amount of complex free fluid seen in the pelvis, particularly on the right adnexal region which may represent possible hemorrhage. CT scan may be performed for further evaluation. Critical Value/emergent results were called by telephone at the time of interpretation on 10/20/2015 at 4:03 pm to Dr. Dorathy Kinsman , who verbally acknowledged these results. Electronically Signed   By: Lupita Raider, M.D.   On: 10/20/2015 16:03   Ct Abdomen Pelvis W Contrast  10/21/2015  CLINICAL DATA:  Lower abdominal pain. Nontraumatic hemoperitoneum. Patient post diagnostic laparoscopy 5 hours prior. EXAM: CT ABDOMEN AND PELVIS WITH CONTRAST TECHNIQUE: Multidetector CT imaging of the abdomen and pelvis was performed using the standard protocol following bolus administration of intravenous contrast. CONTRAST:  ISOVUE-300 IOPAMIDOL (ISOVUE-300) INJECTION 61% COMPARISON:  Pelvic ultrasound yesterday. FINDINGS: Lower chest:  Dependent atelectasis at the lung bases. Trace bilateral pleural fluid. Liver: Normal.  No focal lesion. Hepatobiliary: Gallbladder physiologically distended, no calcified stone. No biliary dilatation. Pancreas: No ductal dilatation or inflammation. Spleen: Normal.  No evidence of splenic injury.  No splenomegaly. Adrenal glands: Normal. Kidneys: Symmetric renal enhancement and excretion. No hydronephrosis. No focal lesion. Stomach/Bowel: Stomach is distended with ingested contrast. No gastric wall thickening. There are no dilated or thickened small bowel loops. Suboptimal evaluation of pelvic bowel loops given lack contrast and paucity of intra-abdominal fat. Small volume of stool throughout the colon without colonic wall thickening. The appendix is not definitively visualized. Vascular/Lymphatic: No retroperitoneal adenopathy. The IVC is intact. Abdominal aorta is normal in caliber. Reproductive: Uterus appears normal. Previous right ovarian cyst is not seen, patient is post drainage of cyst. Left ovary is not seen. Bladder: Decompressed by Foley catheter. Other: Recent postsurgical change includes free intraperitoneal air. This is a normal finding post recent laparoscopy. Minimal free fluid in the pelvis. No large volume pneumoperitoneum. No active extravasation or evidence of active bleeding at this time. No significant mesenteric free fluid. Postsurgical change in the anterior abdominal wall. Musculoskeletal: There are no acute or suspicious osseous abnormalities. IMPRESSION: Expected appearance of the abdomen and pelvis post laparoscopy. Source of hemoperitoneum is not identified. No evidence of active bleeding or a current hemoperitoneum. Electronically Signed   By: Rubye Oaks M.D.   On: 10/21/2015 01:08   [place] Art/ven Flow Abd Pelv Doppler  10/20/2015  CLINICAL DATA:  Acute  right lower quadrant abdominal pain. EXAM: TRANSABDOMINAL AND TRANSVAGINAL ULTRASOUND OF PELVIS DOPPLER ULTRASOUND OF OVARIES  TECHNIQUE: Both transabdominal and transvaginal ultrasound examinations of the pelvis were performed. Transabdominal technique was performed for global imaging of the pelvis including uterus, ovaries, adnexal regions, and pelvic cul-de-sac. It was necessary to proceed with endovaginal exam following the transabdominal exam to visualize the ovaries. Color and duplex Doppler ultrasound was utilized to evaluate blood flow to the ovaries. COMPARISON:  None. FINDINGS: Uterus Measurements: 9.5 x 6.3 x 4.7 cm. No fibroids or other mass visualized. Endometrium Thickness: 15 mm which is within normal limits for patient of reproductive age. No focal abnormality visualized. Right ovary Measurements: 6.9 x 5.7 x 5.1 cm. 4.5 cm simple cyst is noted. Left ovary Measurements: 4.1 x 1.9 x 1.8 cm. Normal appearance/no adnexal mass. Pulsed Doppler evaluation of both ovaries demonstrates normal low-resistance arterial and venous waveforms. Other findings There is noted a moderate amount of complex free fluid in the pelvis, particularly in the right adnexal region. This is concerning for possible hemorrhage. IMPRESSION: 4.5 cm simple right ovarian cyst is noted. There is noted a moderate amount of complex free fluid seen in the pelvis, particularly on the right adnexal region which may represent possible hemorrhage. CT scan may be performed for further evaluation. Critical Value/emergent results were called by telephone at the time of interpretation on 10/20/2015 at 4:03 pm to Dr. Dorathy Kinsman , who verbally acknowledged these results. Electronically Signed   By: Lupita Raider, M.D.   On: 10/20/2015 16:03    Medications: . pantoprazole (PROTONIX) IV  40 mg Intravenous QHS    Assessment/Plan Abdominal pain, hemoperitoneum, right ovarian cyst Diagnostic laparoscopy, drainage of right ovarian cyst, removal of hemoperitoneum,10/20/15, Dr. Gertie Exon Intraoperative consult Dr. Glenna Fellows Antibiotics:  none DVT:  SCD    Plan:  I have ordered a recheck of her labs, along with new type and screen, CMP, ptt, INR.  Right now she looks very stable.  Will follow closely.    LOS: 1 day    Kathryn [name] 10/21/2015 [phone]

## 2015-10-21 NOTE — Telephone Encounter (Signed)
Called and spoke with the patient, she is feeling better, working on pain control. St[facility].  I will have my office call her and set up a f/u appointment in the next few weeks.

## 2015-10-21 NOTE — Progress Notes (Signed)
1 Day Post-Op  Subjective: No complaints  Objective: Vital signs in last 24 hours: Temp:  [98.3 F (36.8 C)-100.9 F (38.3 C)] 98.3 F (36.8 C) (04/19 0800) Pulse Rate:  [71-116] 90 (04/19 1200) Resp:  [11-25] 15 (04/19 1200) BP: (93-122)/(48-83) 119/73 mmHg (04/19 1156) SpO2:  [97 %-100 %] 100 % (04/19 1200) Weight:  [55.3 kg (121 lb 14.6 oz)] 55.3 kg (121 lb 14.6 oz) (04/18 2245) Last BM Date: 10/20/15  Intake/Output from previous day: 04/18 0701 - 04/19 0700 In: 2651.7 [I.V.:2651.7] Out: 3025 [Urine:2125; Blood:900] Intake/Output this shift: Total I/O In: 600 [I.V.:600] Out: 600 [Urine:600]  Resp: clear to auscultation bilaterally Cardio: regular rate and rhythm GI: soft, appropriately tender  Lab Results:   Recent Labs  10/20/15 1417  10/21/15 0925 10/21/15 0958  WBC 12.6*  --   --  9.6  HGB 9.5*  < > 7.9* 8.1*  HCT 28.1*  < > 23.4* 24.1*  PLT 273  --   --  239  < > = values in this interval not displayed. BMET  Recent Labs  10/21/15 0958  NA 140  K 3.6  CL 113*  CO2 21*  GLUCOSE 115*  BUN <5*  CREATININE 0.49  CALCIUM 8.0*   PT/INR  Recent Labs  10/20/15 2010 10/21/15 0958  LABPROT 15.4* 15.6*  INR 1.20 1.27   ABG No results for input(s): PHART, HCO3 in the last 72 hours.  Invalid input(s): PCO2, PO2  Studies/Results: US Transvaginal Non-ob  10/20/2015  CLINICAL DATA:  Acute right lower quadrant abdominal pain. EXAM: TRANSABDOMINAL AND TRANSVAGINAL ULTRASOUND OF PELVIS DOPPLER ULTRASOUND OF OVARIES TECHNIQUE: Both transabdominal and transvaginal ultrasound examinations of the pelvis were performed. Transabdominal technique was performed for global imaging of the pelvis including uterus, ovaries, adnexal regions, and pelvic cul-de-sac. It was necessary to proceed with endovaginal exam following the transabdominal exam to visualize the ovaries. Color and duplex Doppler ultrasound was utilized to evaluate blood flow to the ovaries. COMPARISON:   None. FINDINGS: Uterus Measurements: 9.5 x 6.3 x 4.7 cm. No fibroids or other mass visualized. Endometrium Thickness: 15 mm which is within normal limits for patient of reproductive age. No focal abnormality visualized. Right ovary Measurements: 6.9 x 5.7 x 5.1 cm. 4.5 cm simple cyst is noted. Left ovary Measurements: 4.1 x 1.9 x 1.8 cm. Normal appearance/no adnexal mass. Pulsed Doppler evaluation of both ovaries demonstrates normal low-resistance arterial and venous waveforms. Other findings There is noted a moderate amount of complex free fluid in the pelvis, particularly in the right adnexal region. This is concerning for possible hemorrhage. IMPRESSION: 4.5 cm simple right ovarian cyst is noted. There is noted a moderate amount of complex free fluid seen in the pelvis, particularly on the right adnexal region which may represent possible hemorrhage. CT scan may be performed for further evaluation. Critical Value/emergent results were called by telephone at the time of interpretation on 10/20/2015 at 4:03 pm to Dr. Dorathy Kinsman , who verbally acknowledged these results. Electronically Signed   By: Lupita Raider, M.D.   On: 10/20/2015 16:03   US Pelvis Complete  10/20/2015  CLINICAL DATA:  Acute right lower quadrant abdominal pain. EXAM: TRANSABDOMINAL AND TRANSVAGINAL ULTRASOUND OF PELVIS DOPPLER ULTRASOUND OF OVARIES TECHNIQUE: Both transabdominal and transvaginal ultrasound examinations of the pelvis were performed. Transabdominal technique was performed for global imaging of the pelvis including uterus, ovaries, adnexal regions, and pelvic cul-de-sac. It was necessary to proceed with endovaginal exam following the transabdominal exam to visualize  the ovaries. Color and duplex Doppler ultrasound was utilized to evaluate blood flow to the ovaries. COMPARISON:  None. FINDINGS: Uterus Measurements: 9.5 x 6.3 x 4.7 cm. No fibroids or other mass visualized. Endometrium Thickness: 15 mm which is within normal  limits for patient of reproductive age. No focal abnormality visualized. Right ovary Measurements: 6.9 x 5.7 x 5.1 cm. 4.5 cm simple cyst is noted. Left ovary Measurements: 4.1 x 1.9 x 1.8 cm. Normal appearance/no adnexal mass. Pulsed Doppler evaluation of both ovaries demonstrates normal low-resistance arterial and venous waveforms. Other findings There is noted a moderate amount of complex free fluid in the pelvis, particularly in the right adnexal region. This is concerning for possible hemorrhage. IMPRESSION: 4.5 cm simple right ovarian cyst is noted. There is noted a moderate amount of complex free fluid seen in the pelvis, particularly on the right adnexal region which may represent possible hemorrhage. CT scan may be performed for further evaluation. Critical Value/emergent results were called by telephone at the time of interpretation on 10/20/2015 at 4:03 pm to Dr. Dorathy Kinsman , who verbally acknowledged these results. Electronically Signed   By: Lupita Raider, M.D.   On: 10/20/2015 16:03   Ct Abdomen Pelvis W Contrast  10/21/2015  CLINICAL DATA:  Lower abdominal pain. Nontraumatic hemoperitoneum. Patient post diagnostic laparoscopy 5 hours prior. EXAM: CT ABDOMEN AND PELVIS WITH CONTRAST TECHNIQUE: Multidetector CT imaging of the abdomen and pelvis was performed using the standard protocol following bolus administration of intravenous contrast. CONTRAST:  ISOVUE-300 IOPAMIDOL (ISOVUE-300) INJECTION 61% COMPARISON:  Pelvic ultrasound yesterday. FINDINGS: Lower chest: Dependent atelectasis at the lung bases. Trace bilateral pleural fluid. Liver: Normal.  No focal lesion. Hepatobiliary: Gallbladder physiologically distended, no calcified stone. No biliary dilatation. Pancreas: No ductal dilatation or inflammation. Spleen: Normal.  No evidence of splenic injury.  No splenomegaly. Adrenal glands: Normal. Kidneys: Symmetric renal enhancement and excretion. No hydronephrosis. No focal lesion.  Stomach/Bowel: Stomach is distended with ingested contrast. No gastric wall thickening. There are no dilated or thickened small bowel loops. Suboptimal evaluation of pelvic bowel loops given lack contrast and paucity of intra-abdominal fat. Small volume of stool throughout the colon without colonic wall thickening. The appendix is not definitively visualized. Vascular/Lymphatic: No retroperitoneal adenopathy. The IVC is intact. Abdominal aorta is normal in caliber. Reproductive: Uterus appears normal. Previous right ovarian cyst is not seen, patient is post drainage of cyst. Left ovary is not seen. Bladder: Decompressed by Foley catheter. Other: Recent postsurgical change includes free intraperitoneal air. This is a normal finding post recent laparoscopy. Minimal free fluid in the pelvis. No large volume pneumoperitoneum. No active extravasation or evidence of active bleeding at this time. No significant mesenteric free fluid. Postsurgical change in the anterior abdominal wall. Musculoskeletal: There are no acute or suspicious osseous abnormalities. IMPRESSION: Expected appearance of the abdomen and pelvis post laparoscopy. Source of hemoperitoneum is not identified. No evidence of active bleeding or a current hemoperitoneum. Electronically Signed   By: Rubye Oaks M.D.   On: 10/21/2015 01:08   [place] Art/ven Flow Abd Pelv Doppler  10/20/2015  CLINICAL DATA:  Acute right lower quadrant abdominal pain. EXAM: TRANSABDOMINAL AND TRANSVAGINAL ULTRASOUND OF PELVIS DOPPLER ULTRASOUND OF OVARIES TECHNIQUE: Both transabdominal and transvaginal ultrasound examinations of the pelvis were performed. Transabdominal technique was performed for global imaging of the pelvis including uterus, ovaries, adnexal regions, and pelvic cul-de-sac. It was necessary to proceed with endovaginal exam following the transabdominal exam to visualize the ovaries. Color and  duplex Doppler ultrasound was utilized to evaluate blood flow to the  ovaries. COMPARISON:  None. FINDINGS: Uterus Measurements: 9.5 x 6.3 x 4.7 cm. No fibroids or other mass visualized. Endometrium Thickness: 15 mm which is within normal limits for patient of reproductive age. No focal abnormality visualized. Right ovary Measurements: 6.9 x 5.7 x 5.1 cm. 4.5 cm simple cyst is noted. Left ovary Measurements: 4.1 x 1.9 x 1.8 cm. Normal appearance/no adnexal mass. Pulsed Doppler evaluation of both ovaries demonstrates normal low-resistance arterial and venous waveforms. Other findings There is noted a moderate amount of complex free fluid in the pelvis, particularly in the right adnexal region. This is concerning for possible hemorrhage. IMPRESSION: 4.5 cm simple right ovarian cyst is noted. There is noted a moderate amount of complex free fluid seen in the pelvis, particularly on the right adnexal region which may represent possible hemorrhage. CT scan may be performed for further evaluation. Critical Value/emergent results were called by telephone at the time of interpretation on 10/20/2015 at 4:03 pm to Dr. Dorathy KinsmanVIRGINIA SMITH , who verbally acknowledged these results. Electronically Signed   By: Lupita RaiderJames  Green Jr, M.D.   On: 10/20/2015 16:03    Anti-infectives: Anti-infectives    None      Assessment/Plan: s/p Procedure(s): LAPAROSCOPY DIAGNOSTIC, DRAINAGE OF RIGHT OVARIAN CYST (N/A) Advance diet  Recheck hg. If stable then may transfer to floor later today  LOS: 1 day    TOTH III,PAUL S 10/21/2015

## 2015-10-21 NOTE — Care Management Note (Addendum)
Case Management Note  Patient Details  Name: Kathryn [name] MRN: [id number] Date of Birth: 1990-08-03  Subjective/Objective:          S/plap with ovarian cyst and hematoma of the abd cavity          Action/Plan: Date:  October 21, 2015 Chart reviewed for concurrent status and case management needs. Will continue to follow patient for changes and needs: Marcelle Smilinghonda Davis, BSN, RN, [place]   [phone]  Expected Discharge Date:                  Expected Discharge Plan:  Home/Self Care  In-House Referral:  NA  Discharge planning Services  CM Consult  Post Acute Care Choice:  NA Choice offered to:  NA  DME Arranged:    DME Agency:     HH Arranged:    HH Agency:     Status of Service:  Completed, signed off  Medicare Important Message Given:    Date Medicare IM Given:    Medicare IM give by:    Date Additional Medicare IM Given:    Additional Medicare Important Message give by:     If discussed at Long Length of Stay Meetings, dates discussed:    Additional Comments:  Golda AcreDavis, Rhonda Lynn, RN 10/21/2015, 10:06 AM

## 2015-10-21 NOTE — Progress Notes (Signed)
   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  The patient had a bright affect and was talking to her family and a friend who was at the bedside.  The patient stated that she was doing well today, but was ready to go home.  The patient nor the family needed anything today.  I notified the Chaplains at [facility] about having this patient [facility] at [facility]. 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   Please contact Spiritual Care for further assistance.   Chaplain Clint BolderBrittany Laneshia Pina M.Div.

## 2015-10-22 LAB — CBC
HEMATOCRIT: 24.1 % — AB (ref 36.0–46.0)
HEMOGLOBIN: 8.1 g/dL — AB (ref 12.0–15.0)
MCH: 25.6 pg — ABNORMAL LOW (ref 26.0–34.0)
MCHC: 33.6 g/dL (ref 30.0–36.0)
MCV: 76 fL — ABNORMAL LOW (ref 78.0–100.0)
Platelets: 235 10*3/uL (ref 150–400)
RBC: 3.17 MIL/uL — AB (ref 3.87–5.11)
RDW: 15 % (ref 11.5–15.5)
WBC: 8.3 10*3/uL (ref 4.0–10.5)

## 2015-10-22 LAB — HEMOGLOBIN AND HEMATOCRIT, BLOOD
HCT: 23.7 % — ABNORMAL LOW (ref 36.0–46.0)
Hemoglobin: 8 g/dL — ABNORMAL LOW (ref 12.0–15.0)

## 2015-10-22 MED ORDER — OXYCODONE-ACETAMINOPHEN 5-325 MG PO TABS
1.0000 | ORAL_TABLET | ORAL | Status: DC | PRN
  Administered 2015-10-22: 1 via ORAL
  Administered 2015-10-24: 2 via ORAL
  Filled 2015-10-22: qty 2
  Filled 2015-10-22 (×2): qty 1

## 2015-10-22 MED ORDER — ALBUMIN HUMAN 5 % IV SOLN
12.5000 g | Freq: Once | INTRAVENOUS | Status: AC
  Administered 2015-10-22: 12.5 g via INTRAVENOUS
  Filled 2015-10-22: qty 250

## 2015-10-22 MED ORDER — HEPARIN SODIUM (PORCINE) 5000 UNIT/ML IJ SOLN
5000.0000 [IU] | Freq: Three times a day (TID) | INTRAMUSCULAR | Status: DC
  Filled 2015-10-22 (×7): qty 1

## 2015-10-22 MED ORDER — HYDROMORPHONE HCL 1 MG/ML IJ SOLN
0.5000 mg | INTRAMUSCULAR | Status: DC | PRN
  Administered 2015-10-22 – 2015-10-24 (×7): 1 mg via INTRAVENOUS
  Filled 2015-10-22 (×7): qty 1

## 2015-10-22 NOTE — Telephone Encounter (Signed)
Left Message To Call Back  

## 2015-10-22 NOTE — Progress Notes (Signed)
2 Days Post-Op  Subjective: She looks OK, she says she is dizzy and nauseated  when getting up and has issues with headache going to BR.  I just had the nurse do orthostatic BP's she, didn't really drop, but she did get nauseated just standing this AM.  Objective: Vital signs in last 24 hours: Temp:  [98.2 F (36.8 C)-99.3 F (37.4 C)] 98.6 F (37 C) (04/20 0417) Pulse Rate:  [90-128] 94 (04/20 0600) Resp:  [12-26] 12 (04/20 0600) BP: (119-135)/(73-89) 126/84 mmHg (04/20 0500) SpO2:  [97 %-100 %] 98 % (04/20 0600) Last BM Date: 10/20/15 IV 2300 Urine 1000 Afebrile, no fever since 0300 hours yesterday She remains tachycardic  H/H stable at 8/23  Intake/Output from previous day: 04/19 0701 - 04/20 0700 In: 2300 [I.V.:2300] Out: 1000 [Urine:1000] Intake/Output this shift:    General appearance: alert, cooperative, no distress and she has a headache right now Resp: clear to auscultation bilaterally GI: soft, sore, few BS, flatus x 1  Lab Results:   Recent Labs  10/21/15 0958 10/21/15 1535 10/21/15 2129 10/22/15 0313  WBC 9.6 9.0  --   --   HGB 8.1* 8.0* 8.0* 8.0*  HCT 24.1* 24.1* 23.5* 23.7*  PLT 239 234  --   --     BMET  Recent Labs  10/21/15 0958  NA 140  K 3.6  CL 113*  CO2 21*  GLUCOSE 115*  BUN <5*  CREATININE 0.49  CALCIUM 8.0*   PT/INR  Recent Labs  10/20/15 2010 10/21/15 0958  LABPROT 15.4* 15.6*  INR 1.20 1.27     Recent Labs Lab 10/21/15 0958  AST 16  ALT 12*  ALKPHOS 40  BILITOT 0.6  PROT 6.5  ALBUMIN 3.2*     Lipase  No results found for: LIPASE   Studies/Results: US Transvaginal Non-ob  10/20/2015  CLINICAL DATA:  Acute right lower quadrant abdominal pain. EXAM: TRANSABDOMINAL AND TRANSVAGINAL ULTRASOUND OF PELVIS DOPPLER ULTRASOUND OF OVARIES TECHNIQUE: Both transabdominal and transvaginal ultrasound examinations of the pelvis were performed. Transabdominal technique was performed for global imaging of the pelvis  including uterus, ovaries, adnexal regions, and pelvic cul-de-sac. It was necessary to proceed with endovaginal exam following the transabdominal exam to visualize the ovaries. Color and duplex Doppler ultrasound was utilized to evaluate blood flow to the ovaries. COMPARISON:  None. FINDINGS: Uterus Measurements: 9.5 x 6.3 x 4.7 cm. No fibroids or other mass visualized. Endometrium Thickness: 15 mm which is within normal limits for patient of reproductive age. No focal abnormality visualized. Right ovary Measurements: 6.9 x 5.7 x 5.1 cm. 4.5 cm simple cyst is noted. Left ovary Measurements: 4.1 x 1.9 x 1.8 cm. Normal appearance/no adnexal mass. Pulsed Doppler evaluation of both ovaries demonstrates normal low-resistance arterial and venous waveforms. Other findings There is noted a moderate amount of complex free fluid in the pelvis, particularly in the right adnexal region. This is concerning for possible hemorrhage. IMPRESSION: 4.5 cm simple right ovarian cyst is noted. There is noted a moderate amount of complex free fluid seen in the pelvis, particularly on the right adnexal region which may represent possible hemorrhage. CT scan may be performed for further evaluation. Critical Value/emergent results were called by telephone at the time of interpretation on 10/20/2015 at 4:03 pm to Dr. Dorathy Kinsman , who verbally acknowledged these results. Electronically Signed   By: Lupita Raider, M.D.   On: 10/20/2015 16:03   US Pelvis Complete  10/20/2015  CLINICAL DATA:  Acute  right lower quadrant abdominal pain. EXAM: TRANSABDOMINAL AND TRANSVAGINAL ULTRASOUND OF PELVIS DOPPLER ULTRASOUND OF OVARIES TECHNIQUE: Both transabdominal and transvaginal ultrasound examinations of the pelvis were performed. Transabdominal technique was performed for global imaging of the pelvis including uterus, ovaries, adnexal regions, and pelvic cul-de-sac. It was necessary to proceed with endovaginal exam following the transabdominal  exam to visualize the ovaries. Color and duplex Doppler ultrasound was utilized to evaluate blood flow to the ovaries. COMPARISON:  None. FINDINGS: Uterus Measurements: 9.5 x 6.3 x 4.7 cm. No fibroids or other mass visualized. Endometrium Thickness: 15 mm which is within normal limits for patient of reproductive age. No focal abnormality visualized. Right ovary Measurements: 6.9 x 5.7 x 5.1 cm. 4.5 cm simple cyst is noted. Left ovary Measurements: 4.1 x 1.9 x 1.8 cm. Normal appearance/no adnexal mass. Pulsed Doppler evaluation of both ovaries demonstrates normal low-resistance arterial and venous waveforms. Other findings There is noted a moderate amount of complex free fluid in the pelvis, particularly in the right adnexal region. This is concerning for possible hemorrhage. IMPRESSION: 4.5 cm simple right ovarian cyst is noted. There is noted a moderate amount of complex free fluid seen in the pelvis, particularly on the right adnexal region which may represent possible hemorrhage. CT scan may be performed for further evaluation. Critical Value/emergent results were called by telephone at the time of interpretation on 10/20/2015 at 4:03 pm to Dr. Dorathy Kinsman , who verbally acknowledged these results. Electronically Signed   By: Lupita Raider, M.D.   On: 10/20/2015 16:03   Ct Abdomen Pelvis W Contrast  10/21/2015  CLINICAL DATA:  Lower abdominal pain. Nontraumatic hemoperitoneum. Patient post diagnostic laparoscopy 5 hours prior. EXAM: CT ABDOMEN AND PELVIS WITH CONTRAST TECHNIQUE: Multidetector CT imaging of the abdomen and pelvis was performed using the standard protocol following bolus administration of intravenous contrast. CONTRAST:  ISOVUE-300 IOPAMIDOL (ISOVUE-300) INJECTION 61% COMPARISON:  Pelvic ultrasound yesterday. FINDINGS: Lower chest: Dependent atelectasis at the lung bases. Trace bilateral pleural fluid. Liver: Normal.  No focal lesion. Hepatobiliary: Gallbladder physiologically  distended, no calcified stone. No biliary dilatation. Pancreas: No ductal dilatation or inflammation. Spleen: Normal.  No evidence of splenic injury.  No splenomegaly. Adrenal glands: Normal. Kidneys: Symmetric renal enhancement and excretion. No hydronephrosis. No focal lesion. Stomach/Bowel: Stomach is distended with ingested contrast. No gastric wall thickening. There are no dilated or thickened small bowel loops. Suboptimal evaluation of pelvic bowel loops given lack contrast and paucity of intra-abdominal fat. Small volume of stool throughout the colon without colonic wall thickening. The appendix is not definitively visualized. Vascular/Lymphatic: No retroperitoneal adenopathy. The IVC is intact. Abdominal aorta is normal in caliber. Reproductive: Uterus appears normal. Previous right ovarian cyst is not seen, patient is post drainage of cyst. Left ovary is not seen. Bladder: Decompressed by Foley catheter. Other: Recent postsurgical change includes free intraperitoneal air. This is a normal finding post recent laparoscopy. Minimal free fluid in the pelvis. No large volume pneumoperitoneum. No active extravasation or evidence of active bleeding at this time. No significant mesenteric free fluid. Postsurgical change in the anterior abdominal wall. Musculoskeletal: There are no acute or suspicious osseous abnormalities. IMPRESSION: Expected appearance of the abdomen and pelvis post laparoscopy. Source of hemoperitoneum is not identified. No evidence of active bleeding or a current hemoperitoneum. Electronically Signed   By: Rubye Oaks M.D.   On: 10/21/2015 01:08   [place] Art/ven Flow Abd Pelv Doppler  10/20/2015  CLINICAL DATA:  Acute right lower quadrant abdominal  pain. EXAM: TRANSABDOMINAL AND TRANSVAGINAL ULTRASOUND OF PELVIS DOPPLER ULTRASOUND OF OVARIES TECHNIQUE: Both transabdominal and transvaginal ultrasound examinations of the pelvis were performed. Transabdominal technique was performed for global  imaging of the pelvis including uterus, ovaries, adnexal regions, and pelvic cul-de-sac. It was necessary to proceed with endovaginal exam following the transabdominal exam to visualize the ovaries. Color and duplex Doppler ultrasound was utilized to evaluate blood flow to the ovaries. COMPARISON:  None. FINDINGS: Uterus Measurements: 9.5 x 6.3 x 4.7 cm. No fibroids or other mass visualized. Endometrium Thickness: 15 mm which is within normal limits for patient of reproductive age. No focal abnormality visualized. Right ovary Measurements: 6.9 x 5.7 x 5.1 cm. 4.5 cm simple cyst is noted. Left ovary Measurements: 4.1 x 1.9 x 1.8 cm. Normal appearance/no adnexal mass. Pulsed Doppler evaluation of both ovaries demonstrates normal low-resistance arterial and venous waveforms. Other findings There is noted a moderate amount of complex free fluid in the pelvis, particularly in the right adnexal region. This is concerning for possible hemorrhage. IMPRESSION: 4.5 cm simple right ovarian cyst is noted. There is noted a moderate amount of complex free fluid seen in the pelvis, particularly on the right adnexal region which may represent possible hemorrhage. CT scan may be performed for further evaluation. Critical Value/emergent results were called by telephone at the time of interpretation on 10/20/2015 at 4:03 pm to Dr. Dorathy KinsmanVIRGINIA SMITH , who verbally acknowledged these results. Electronically Signed   By: Lupita RaiderJames  Green Jr, M.D.   On: 10/20/2015 16:03    Medications: . pantoprazole (PROTONIX) IV  40 mg Intravenous QHS    Assessment/Plan Abdominal pain, hemoperitoneum, right ovarian cyst Diagnostic laparoscopy, drainage of right ovarian cyst, removal of hemoperitoneum,10/20/15, Dr. Gertie ExonJill Jertson Intraoperative consult Dr. Glenna FellowsBenjamin Hoxworth Antibiotics: none DVT: SCD   Plan:  I will send keep her in SDU.  I will get full CBC at noon and tomorrow AM.  Check her for ortho stasis, advance her diet as she tolerates it.   She has clears but hasn't taken much.  I am putting her on full liquids but we talked about just advancing as she tolerates it.  Add Heparin for DVT. She didn't tolerated standing at all, got nauseated with standing.  We may have to consider transfusing her if this persist.     LOS: 2 days    Sherrie GeorgeJENNINGS,Hadden Steig 10/22/2015 [phone]

## 2015-10-23 LAB — BASIC METABOLIC PANEL
ANION GAP: 6 (ref 5–15)
CHLORIDE: 110 mmol/L (ref 101–111)
CO2: 24 mmol/L (ref 22–32)
Calcium: 8.6 mg/dL — ABNORMAL LOW (ref 8.9–10.3)
Creatinine, Ser: 0.47 mg/dL (ref 0.44–1.00)
GFR calc Af Amer: >60 mL/min (ref >60)
GLUCOSE: 99 mg/dL (ref 65–99)
POTASSIUM: 3.9 mmol/L (ref 3.5–5.1)
Sodium: 140 mmol/L (ref 135–145)

## 2015-10-23 LAB — CBC
HCT: 24.8 % — ABNORMAL LOW (ref 36.0–46.0)
HEMOGLOBIN: 8.3 g/dL — AB (ref 12.0–15.0)
MCH: 26.1 pg (ref 26.0–34.0)
MCHC: 33.5 g/dL (ref 30.0–36.0)
MCV: 78 fL (ref 78.0–100.0)
PLATELETS: 269 10*3/uL (ref 150–400)
RBC: 3.18 MIL/uL — AB (ref 3.87–5.11)
RDW: 15 % (ref 11.5–15.5)
WBC: 8.4 10*3/uL (ref 4.0–10.5)

## 2015-10-23 MED ORDER — KETOROLAC TROMETHAMINE 30 MG/ML IJ SOLN
30.0000 mg | Freq: Three times a day (TID) | INTRAMUSCULAR | Status: DC | PRN
  Administered 2015-10-23: 30 mg via INTRAVENOUS
  Filled 2015-10-23: qty 1

## 2015-10-23 MED ORDER — KETOROLAC TROMETHAMINE 30 MG/ML IJ SOLN
30.0000 mg | Freq: Once | INTRAMUSCULAR | Status: AC
  Administered 2015-10-23: 30 mg via INTRAVENOUS
  Filled 2015-10-23 (×2): qty 1

## 2015-10-23 MED ORDER — PANTOPRAZOLE SODIUM 40 MG PO TBEC
40.0000 mg | DELAYED_RELEASE_TABLET | Freq: Every day | ORAL | Status: DC
  Administered 2015-10-23 – 2015-10-24 (×2): 40 mg via ORAL
  Filled 2015-10-23 (×2): qty 1

## 2015-10-23 NOTE — Progress Notes (Signed)
3 Days Post-Op  Subjective: She still has a headache and some dizziness when she gets up.  She has headaches with her periods and she is having one now.  They normally do not last this long.  She is passing flatus and not having any trouble with the liquids. She isn't taking much PO.    Objective: Vital signs in last 24 hours: Temp:  [97.9 F (36.6 C)-99.2 F (37.3 C)] 99 F (37.2 C) (04/21 0400) Pulse Rate:  [85-104] 99 (04/21 0645) Resp:  [12-29] 17 (04/21 0645) BP: (105-124)/(66-81) 124/81 mmHg (04/21 0645) SpO2:  [99 %-100 %] 100 % (04/21 0645) Last BM Date: 10/20/15 200 PO recorded 2100 IV Voided x 4 Afebrile, VSS, no orthostasis with single one done yesterday H/H is stable Intake/Output from previous day: 04/20 0701 - 04/21 0700 In: 2300 [P.O.:200; I.V.:2100] Out: -  Intake/Output this shift:    General appearance: alert, cooperative and no distress Resp: clear to auscultation bilaterally Cardio: still a little tachycardic but sinus. GI: soft, sore, + BS and flatus  Lab Results:   Recent Labs  10/22/15 1202 10/23/15 0313  WBC 8.3 8.4  HGB 8.1* 8.3*  HCT 24.1* 24.8*  PLT 235 269    BMET  Recent Labs  10/21/15 0958 10/23/15 0313  NA 140 140  K 3.6 3.9  CL 113* 110  CO2 21* 24  GLUCOSE 115* 99  BUN <5* <5*  CREATININE 0.49 0.47  CALCIUM 8.0* 8.6*   PT/INR  Recent Labs  10/20/15 2010 10/21/15 0958  LABPROT 15.4* 15.6*  INR 1.20 1.27     Recent Labs Lab 10/21/15 0958  AST 16  ALT 12*  ALKPHOS 40  BILITOT 0.6  PROT 6.5  ALBUMIN 3.2*     Lipase  No results found for: LIPASE   Studies/Results: No results found.  Medications: . heparin subcutaneous  5,000 Units Subcutaneous Q8H  . pantoprazole (PROTONIX) IV  40 mg Intravenous QHS    Assessment/Plan Abdominal pain, hemoperitoneum, right ovarian cyst Diagnostic laparoscopy, drainage of right ovarian cyst, removal of hemoperitoneum,10/20/15, Dr. Gertie ExonJill Jertson Intraoperative  consult Dr. Glenna FellowsBenjamin Hoxworth Post op dizziness and headache Antibiotics: none DVT: SCD/Heparin  Plan:  I ordered orthostatic BP BID, we only got one.  I will try again.  I am going to up her to a soft diet as tolerated and told her to just take what feels good and to back off if she has nausea.  I will transfer to the floor.  i told her I wanted her up in the chair more today.  See how she  Progresses.    LOS: 3 days    Savina Olshefski 10/23/2015 [phone]

## 2015-10-23 NOTE — Telephone Encounter (Signed)
Left Message To Call Back  

## 2015-10-23 NOTE — Telephone Encounter (Signed)
Patient scheduled for follow up appointment 10/28/2015 at 9:45.

## 2015-10-24 LAB — CBC
HCT: 23 % — ABNORMAL LOW (ref 36.0–46.0)
Hemoglobin: 7.9 g/dL — ABNORMAL LOW (ref 12.0–15.0)
MCH: 26.4 pg (ref 26.0–34.0)
MCHC: 34.3 g/dL (ref 30.0–36.0)
MCV: 76.9 fL — ABNORMAL LOW (ref 78.0–100.0)
Platelets: 280 10*3/uL (ref 150–400)
RBC: 2.99 MIL/uL — ABNORMAL LOW (ref 3.87–5.11)
RDW: 15 % (ref 11.5–15.5)
WBC: 6.9 10*3/uL (ref 4.0–10.5)

## 2015-10-24 LAB — TYPE AND SCREEN
ABO/RH(D): O POS
Antibody Screen: NEGATIVE
UNIT DIVISION: 0
Unit division: 0

## 2015-10-24 MED ORDER — OXYCODONE-ACETAMINOPHEN 5-325 MG PO TABS: 1.0000 | ORAL_TABLET | ORAL | Status: DC | PRN

## 2015-10-24 NOTE — Discharge Instructions (Addendum)
LAPAROSCOPIC SURGERY: POST OP INSTRUCTIONS ° °1. DIET: Follow a light bland diet the first 24 hours after arrival home, such as soup, liquids, crackers, etc.  Be sure to include lots of fluids daily.  Avoid fast food or heavy meals as your are more likely to get nauseated.  Eat a low fat the next few days after surgery.   °2. Take your usually prescribed home medications unless otherwise directed. °3. PAIN CONTROL: °a. Pain is best controlled by a usual combination of three different methods TOGETHER: °i. Ice/Heat °ii. Over the counter pain medication °iii. Prescription pain medication °b. Most patients will experience some swelling and bruising around the incisions.  Ice packs or heating pads (30-60 minutes up to 6 times a day) will help. Use ice for the first few days to help decrease swelling and bruising, then switch to heat to help relax tight/sore spots and speed recovery.  Some people prefer to use ice alone, heat alone, alternating between ice & heat.  Experiment to what works for you.  Swelling and bruising can take several weeks to resolve.   °c. It is helpful to take an over-the-counter pain medication regularly for the first few weeks.  Choose one of the following that works best for you: °i. Naproxen (Aleve, etc)  Two 220mg tabs twice a day °ii. Ibuprofen (Advil, etc) Three 200mg tabs four times a day (every meal & bedtime) °d. A  prescription for pain medication (such as percocet, vicodin, oxycodone, hydrocodone, etc) should be given to you upon discharge.  Take your pain medication as prescribed.  °i. If you are having problems/concerns with the prescription medicine (does not control pain, nausea, vomiting, rash, itching, etc), please call us [phone] to see if we need to switch you to a different pain medicine that will work better for you and/or control your side effect better. °ii. If you need a refill on your pain medication, please contact your pharmacy.  They will contact our office to  request authorization. Prescriptions will not be filled after 5 pm or on week-ends. ° ° °4. Avoid getting constipated.  Between the surgery and the pain medications, it is common to experience some constipation.  Increasing fluid intake and taking a fiber supplement (such as Metamucil, Citrucel, FiberCon, MiraLax, etc) 1-2 times a day regularly will usually help prevent this problem from occurring.  A mild laxative (prune juice, Milk of Magnesia, MiraLax, etc) should be taken according to package directions if there are no bowel movements after 48 hours.   °5. Watch out for diarrhea.  If you have many loose bowel movements, simplify your diet to bland foods & liquids for a few days.  Stop any stool softeners and decrease your fiber supplement.  Switching to mild anti-diarrheal medications (Kayopectate, Pepto Bismol) can help.  If this worsens or does not improve, please call us. °6. Wash / shower every day.  You may shower over the dressings as they are waterproof.  Continue to shower over incision(s) after the dressing is off. °7. Remove your waterproof bandages 5 days after surgery.  You may leave the incision open to air.  You may replace a dressing/Band-Aid to cover the incision for comfort if you wish.  °8. ACTIVITIES as tolerated:   °a. You may resume regular (light) daily activities beginning the next day--such as daily self-care, walking, climbing stairs--gradually increasing activities as tolerated.  If you can walk 30 minutes without difficulty, it is safe to try more intense activity such as   jogging, treadmill, bicycling, low-impact aerobics, swimming, etc. b. Save the most intensive and strenuous activity for last such as sit-ups, heavy lifting, contact sports, etc  Refrain from any heavy lifting or straining until you are off narcotics for pain control.   c. DO NOT PUSH THROUGH PAIN.  Let pain be your guide: If it hurts to do something, don't do it.  Pain is your body warning you to avoid that  activity for another week until the pain goes down. d. You may drive when you are no longer taking prescription pain medication, you can comfortably wear a seatbelt, and you can safely maneuver your car and apply brakes. e. Bonita QuinYou may have sexual intercourse when it is comfortable.  9. FOLLOW UP in our office a. Please call [facility] at [phone] to set up an appointment to see your surgeon in the office for a follow-up appointment approximately 2-3 weeks after your surgery. b. Make sure that you call for this appointment the day you arrive home to insure a convenient appointment time. 10. IF YOU HAVE DISABILITY OR FAMILY LEAVE FORMS, BRING THEM TO THE OFFICE FOR PROCESSING.  DO NOT GIVE THEM TO YOUR DOCTOR.  Take an Iron supplement daily for the next month to help rebuild your blood counts.   WHEN TO CALL US [phone]: 1. Poor pain control 2. Reactions / problems with new medications (rash/itching, nausea, etc)  3. Fever over 101.5 F (38.5 C) 4. Inability to urinate 5. Nausea and/or vomiting 6. Worsening swelling or bruising 7. Continued bleeding from incision. 8. Increased pain, redness, or drainage from the incision   The clinic staff is available to answer your questions during regular business hours (8:30am-5pm).  Please dont hesitate to call and ask to speak to one of our nurses for clinical concerns.   If you have a medical emergency, go to the nearest emergency room or call 911.  [facility] from [facility] always on call at the [facility]   [facility], [place] [address], [address], [place], [place]  [address] ? MAIN: [phone] ? TOLL FREE: [phone] ?  FAX [phone] [email or web address]

## 2015-10-24 NOTE — Progress Notes (Signed)
Assessment unchanged. Pt's discharge appropriate and expected. Po analgesics effective this shift. tp verbalized understanding of dc instructions through teach back including follow up care and when to call the doctor. Scripts x 1 given as provided by MD. Discharged via wc to front entrance accompanied by mother and daughter.

## 2015-10-24 NOTE — Discharge Summary (Signed)
Physician Discharge Summary  Patient ID: Kathryn [name] MRN: [id number] DOB/AGE: [birth year] 25 y.o.  Admit date: 10/20/2015 Discharge date: 10/24/2015  Admission Diagnoses: Hemoperitoneum   Discharge Diagnoses:  Active Problems:   Hemoperitoneum, nontraumatic   Discharged Condition: good  Hospital Course: Patient admitted after hemoperitoneum thought to be due to ruptured ovarian cyst.  [facility] due to concerns of rebleeding by her Gynecologist.  Since admission Hgb has been stable.  She is now tolerating a diet and her pain is controlled.    Consults: None  Significant Diagnostic Studies: labs: cbc  Treatments: IV hydration, analgesia: acetaminophen w/ codeine and surgery: diagnostic laparoscopy and washout of hemoperitoneum.   Discharge Exam: Blood pressure 105/59, pulse 79, temperature 98.1 F (36.7 C), temperature source Oral, resp. rate 16, height 5' (1.524 m), weight 55.3 kg (121 lb 14.6 oz), last menstrual period 09/21/2015, SpO2 99 %. General appearance: alert and cooperative GI: soft, non-distended Incision/Wound: clean, dry, intact  Disposition: Home    Medication List    TAKE these medications        Garlic 1000 MG Caps  Take 2 capsules by mouth daily as needed (for cold symptoms).     ibuprofen 200 MG tablet  Commonly known as:  ADVIL,MOTRIN  Take 400 mg by mouth every 6 (six) hours as needed for mild pain or moderate pain.     oxyCODONE-acetaminophen 5-325 MG tablet  Commonly known as:  PERCOCET/ROXICET  Take 1-2 tablets by mouth every 4 (four) hours as needed for moderate pain.       Follow-up Information    Follow up with HOXWORTH,BENJAMIN T, MD. Schedule an appointment as soon as possible for a visit in 2 weeks.   Specialty:  General Surgery   Contact information:   [address] [place] [address] [phone]       Signed: Vanita PandaHOMAS, Tammala Weider C. 10/24/2015, 8:05 AM

## 2015-10-28 ENCOUNTER — Ambulatory Visit (INDEPENDENT_AMBULATORY_CARE_PROVIDER_SITE_OTHER): Payer: Federal, State, Local not specified - PPO | Admitting: Obstetrics and Gynecology

## 2015-10-28 ENCOUNTER — Encounter: Payer: Self-pay | Admitting: Obstetrics and Gynecology

## 2015-10-28 VITALS — BP 122/60 | HR 84 | Resp 16 | Ht 60.0 in | Wt 113.0 lb

## 2015-10-28 DIAGNOSIS — Z9889 Other specified postprocedural states: Secondary | ICD-10-CM

## 2015-10-28 DIAGNOSIS — D62 Acute posthemorrhagic anemia: Secondary | ICD-10-CM

## 2015-10-28 LAB — CBC
HCT: 29.7 % — ABNORMAL LOW (ref 35.0–45.0)
HEMOGLOBIN: 9.5 g/dL — AB (ref 11.7–15.5)
MCH: 25.1 pg — AB (ref 27.0–33.0)
MCHC: 32 g/dL (ref 32.0–36.0)
MCV: 78.6 fL — AB (ref 80.0–100.0)
MPV: 8.8 fL (ref 7.5–12.5)
PLATELETS: 450 10*3/uL — AB (ref 140–400)
RBC: 3.78 MIL/uL — AB (ref 3.80–5.10)
RDW: 15.9 % — ABNORMAL HIGH (ref 11.0–15.0)
WBC: 5 10*3/uL (ref 3.8–10.8)

## 2015-10-28 LAB — FERRITIN: Ferritin: 11 ng/mL (ref 10–154)

## 2015-10-28 NOTE — Progress Notes (Signed)
Patient ID: Kathryn [name], female   DOB: 12/07/1990, 25 y.o.   MRN: [id number] 25 y.o. G0P0000 Single IndianF here for follow up laparoscopy surgery.  The patient is 1 week s/p diagnostic laparoscopy with removal of a spontaneous hemoperitoneum, EBL of 900 cc. General surgery was consulted, no source found.  Still sore from her surgery, tolerable. She is on oxycodone one x a day. Only one BM since surgery. Taking colace. She is on iron.   Period Cycle (Days): 28 Period Duration (Days): 5-6 days  Period Pattern: Regular Menstrual Flow: Moderate Menstrual Control: Maxi pad Dysmenorrhea: (!) Moderate Dysmenorrhea Symptoms: Cramping  Patient's last menstrual period was 10/21/2015.          Sexually active: Yes.    The current method of family planning is none.    Exercising: No.  The patient does not participate in regular exercise at present. Smoker:  no  Health Maintenance: Pap:  Never History of abnormal Pap:  N/A TDaP:  Up to date Gardasil: no   reports that she has never smoked. She has never used smokeless tobacco. She reports that she does not drink alcohol or use illicit drugs.  Past Medical History  Diagnosis Date  . Medical history non-contributory     Past Surgical History  Procedure Laterality Date  . No past surgeries    . Laparoscopy N/A 10/20/2015    Procedure: LAPAROSCOPY DIAGNOSTIC, DRAINAGE OF RIGHT OVARIAN CYST;  Surgeon: Romualdo BolkJill Evelyn Dymin Dingledine, MD;  Location: [facility];  Service: Gynecology;  Laterality: N/A;  . Pelvic laparoscopy      Current Outpatient Prescriptions  Medication Sig Dispense Refill  . Garlic 1000 MG CAPS Take 2 capsules by mouth daily as needed (for cold symptoms).    Marland Kitchen. ibuprofen (ADVIL,MOTRIN) 200 MG tablet Take 400 mg by mouth every 6 (six) hours as needed for mild pain or moderate pain.    Marland Kitchen. oxyCODONE-acetaminophen (PERCOCET/ROXICET) 5-325 MG tablet Take 1-2 tablets by mouth every 4 (four) hours as needed for moderate pain. 20 tablet 0   No  current facility-administered medications for this visit.    Family History  Problem Relation Age of Onset  . Hypertension Father     Review of Systems  Constitutional: Negative.   HENT: Negative.   Eyes: Negative.   Respiratory: Negative.   Cardiovascular: Negative.   Gastrointestinal: Negative.   Endocrine: Negative.   Genitourinary: Negative.        Abdominal pain and soreness   Musculoskeletal: Negative.   Skin: Negative.   Allergic/Immunologic: Negative.   Neurological: Negative.   Psychiatric/Behavioral: Negative.     Exam:   BP 122/60 mmHg  Pulse 84  Resp 16  Ht 5' (1.524 m)  Wt 113 lb (51.256 kg)  BMI 22.07 kg/m2  LMP 10/21/2015  Weight change: @WEIGHTCHANGE @ Height:   Height: 5' (152.4 cm)  Ht Readings from Last 3 Encounters:  10/28/15 5' (1.524 m)  10/20/15 5' (1.524 m)    General appearance: alert, cooperative and appears stated age Abdomen: soft, non-tender; mildly distended; no masses,  no organomegaly Incisions are clean, dry and intact without erythema    A:  1 week post op s/p diagnostic laparoscopy, drainage of spontaneous hemoperitoneum and left ovarian cyst drainage. No clear source of her bleeding was found.  Anemia, hgb down to 7.9 post op  P:   Take ibuprofen, only use oxycodone for back up pain  CBC, Ferritin  Miralax to help with constipation  She is on iron  Return in  2 weeks for an annual  Call with any concerns  Can start back at work next week with 8 hour shifts

## 2015-10-29 DIAGNOSIS — Z0289 Encounter for other administrative examinations: Secondary | ICD-10-CM

## 2015-11-11 ENCOUNTER — Ambulatory Visit (INDEPENDENT_AMBULATORY_CARE_PROVIDER_SITE_OTHER): Payer: Federal, State, Local not specified - PPO | Admitting: Obstetrics and Gynecology

## 2015-11-11 ENCOUNTER — Encounter: Payer: Self-pay | Admitting: Obstetrics and Gynecology

## 2015-11-11 VITALS — BP 100/58 | HR 88 | Resp 15 | Ht 60.0 in | Wt 113.0 lb

## 2015-11-11 DIAGNOSIS — B373 Candidiasis of vulva and vagina: Secondary | ICD-10-CM | POA: Diagnosis not present

## 2015-11-11 DIAGNOSIS — Z23 Encounter for immunization: Secondary | ICD-10-CM | POA: Diagnosis not present

## 2015-11-11 DIAGNOSIS — Z113 Encounter for screening for infections with a predominantly sexual mode of transmission: Secondary | ICD-10-CM | POA: Diagnosis not present

## 2015-11-11 DIAGNOSIS — Z124 Encounter for screening for malignant neoplasm of cervix: Secondary | ICD-10-CM | POA: Diagnosis not present

## 2015-11-11 DIAGNOSIS — Z7189 Other specified counseling: Secondary | ICD-10-CM

## 2015-11-11 DIAGNOSIS — N63 Unspecified lump in breast: Secondary | ICD-10-CM | POA: Diagnosis not present

## 2015-11-11 DIAGNOSIS — N6313 Unspecified lump in the right breast, lower outer quadrant: Secondary | ICD-10-CM

## 2015-11-11 DIAGNOSIS — Z7185 Encounter for immunization safety counseling: Secondary | ICD-10-CM

## 2015-11-11 DIAGNOSIS — Z01419 Encounter for gynecological examination (general) (routine) without abnormal findings: Secondary | ICD-10-CM | POA: Diagnosis not present

## 2015-11-11 DIAGNOSIS — B3731 Acute candidiasis of vulva and vagina: Secondary | ICD-10-CM

## 2015-11-11 MED ORDER — FLUCONAZOLE 150 MG PO TABS: 150.0000 mg | ORAL_TABLET | Freq: Once | ORAL | Status: DC

## 2015-11-11 NOTE — Progress Notes (Signed)
Scheduled patient while in office for R Breast Ultrasound 11/24/15 at 1450. Follow up office visit with Dr. Oscar LaJertson for Breast Check 12/17/15

## 2015-11-11 NOTE — Progress Notes (Addendum)
Patient ID: Kathryn [name], female   DOB: [birth year], 25 y.o.   MRN: [id number] 25 y.o. G0P0000 Single IndianF here for annual exam.  The patient recently had surgery for hemoperitoneum without a source of the bleeding identified. She was admitted for observation with General surgery and had negative imaging. She c/o a 2 day h/o mild itching, no abnormal discharge She is sexually active, same partner since the beginning of the year. Using condoms.    Period Cycle (Days): 28 Period Duration (Days): 5-6 days  Period Pattern: Regular Menstrual Flow: Moderate Menstrual Control: Maxi pad Dysmenorrhea: (!) Moderate Dysmenorrhea Symptoms: Cramping Saturates a pad 3 x a day. Cramps are bad, helped with ibuprofen.   Patient's last menstrual period was 10/21/2015.          Sexually active: Yes.    The current method of family planning is none.    Exercising: No.  The patient does not participate in regular exercise at present. Smoker:  no  Health Maintenance: Pap:  Never History of abnormal Pap:  N/A TDaP:  Up to date Gardasil: No   reports that she has never smoked. She has never used smokeless tobacco. She reports that she does not drink alcohol or use illicit drugs.The patient is a [employer]  Past Medical History  Diagnosis Date  . Medical history non-contributory     Past Surgical History  Procedure Laterality Date  . No past surgeries    . Laparoscopy N/A 10/20/2015    Procedure: LAPAROSCOPY DIAGNOSTIC, DRAINAGE OF RIGHT OVARIAN CYST;  Surgeon: Romualdo Bolk, MD;  Location: [facility];  Service: Gynecology;  Laterality: N/A;  . Pelvic laparoscopy      Current Outpatient Prescriptions  Medication Sig Dispense Refill  . Garlic 1000 MG CAPS Take 2 capsules by mouth daily as needed (for cold symptoms).    Marland Kitchen ibuprofen (ADVIL,MOTRIN) 200 MG tablet Take 400 mg by mouth every 6 (six) hours as needed for mild pain or moderate pain.     No current facility-administered medications for this  visit.    Family History  Problem Relation Age of Onset  . Hypertension Father     Review of Systems  Constitutional: Negative.   HENT: Negative.   Eyes: Negative.   Respiratory: Negative.   Cardiovascular: Negative.   Gastrointestinal: Negative.   Endocrine: Negative.   Genitourinary: Negative.   Musculoskeletal: Negative.   Skin: Negative.   Allergic/Immunologic: Negative.   Neurological: Negative.   Psychiatric/Behavioral: Negative.     Exam:   BP 100/58 mmHg  Pulse 88  Resp 15  Ht 5' (1.524 m)  Wt 113 lb (51.256 kg)  BMI 22.07 kg/m2  LMP 10/21/2015  Weight change: @ Height:   Height: 5' (152.4 cm)  Ht Readings from Last 3 Encounters:  11/11/15 5' (1.524 m)  10/28/15 5' (1.524 m)  10/20/15 5' (1.524 m)    General appearance: alert, cooperative and appears stated age Head: Normocephalic, without obvious abnormality, atraumatic Neck: no adenopathy, supple, symmetrical, trachea midline and thyroid normal to inspection and palpation Lungs: clear to auscultation bilaterally Breasts: 3-4 mm firm mobile lump in the right breast at 6-7 o'clock outside of the areolar region. Tender. No other lumps, no skin changes. Heart: regular rate and rhythm Abdomen: soft, non-tender; bowel sounds normal; no masses,  no organomegaly Extremities: extremities normal, atraumatic, no cyanosis or edema Skin: Skin color, texture, turgor normal. No rashes or lesions Lymph nodes: Cervical, supraclavicular, and axillary nodes normal. No abnormal inguinal nodes palpated Neurologic:  Grossly normal   Pelvic: External genitalia:  no lesions              Urethra:  normal appearing urethra with no masses, tenderness or lesions              Bartholins and Skenes: normal                 Vagina: normal appearing vagina with normal color and discharge, no lesions              Cervix: no lesions               Bimanual Exam:  Uterus:  normal size, contour, position, consistency,  mobility, non-tender              Adnexa: no mass, fullness, tenderness               Rectovaginal: deferred, recently done in the ER  Chaperone was present for exam.  Wet prep: no clue, no trich, ++ wbc KOH: + yeast PH: 4   A:  Well Woman with normal exam   Screening cervical cancer   Screening for STD   Right breast lump   Yeast Vaginitis  P:   Pap with reflex hpv  Start Gardasil series  Continue condoms  Discussed other options for contraception. Information given on OCP's, nexplanon and the mirena IUD. No contraindications  Discussed breast self exam  Discussed calcium and vit D intake  Right breast ultrasound, f/u breast check in 6 weeks  Treat with diflucan

## 2015-11-11 NOTE — Patient Instructions (Addendum)
Oral Contraception Information Oral contraceptive pills (OCPs) are medicines taken to prevent pregnancy. OCPs work by preventing the ovaries from releasing eggs. The hormones in OCPs also cause the cervical mucus to thicken, preventing the sperm from entering the uterus. The hormones also cause the uterine lining to become thin, not allowing a fertilized egg to attach to the inside of the uterus. OCPs are highly effective when taken exactly as prescribed. However, OCPs do not prevent sexually transmitted diseases (STDs). Safe sex practices, such as using condoms along with the pill, can help prevent STDs.  Before taking the pill, you may have a physical exam and Pap test. Your health care provider may order blood tests. The health care provider will make sure you are a good candidate for oral contraception. Discuss with your health care provider the possible side effects of the OCP you may be prescribed. When starting an OCP, it can take 2 to 3 months for the body to adjust to the changes in hormone levels in your body.  TYPES OF ORAL CONTRACEPTION  The combination pill--This pill contains estrogen and progestin (synthetic progesterone) hormones. The combination pill comes in 21-day, 28-day, or 91-day packs. Some types of combination pills are meant to be taken continuously (365-day pills). With 21-day packs, you do not take pills for 7 days after the last pill. With 28-day packs, the pill is taken every day. The last 7 pills are without hormones. Certain types of pills have more than 21 hormone-containing pills. With 91-day packs, the first 84 pills contain both hormones, and the last 7 pills contain no hormones or contain estrogen only.  The minipill--This pill contains the progesterone hormone only. The pill is taken every day continuously. It is very important to take the pill at the same time each day. The minipill comes in packs of 28 pills. All 28 pills contain the hormone.  ADVANTAGES OF ORAL  CONTRACEPTIVE PILLS  Decreases premenstrual symptoms.   Treats menstrual period cramps.   Regulates the menstrual cycle.   Decreases a heavy menstrual flow.   May treatacne, depending on the type of pill.   Treats abnormal uterine bleeding.   Treats polycystic ovarian syndrome.   Treats endometriosis.   Can be used as emergency contraception.  THINGS THAT CAN MAKE ORAL CONTRACEPTIVE PILLS LESS EFFECTIVE OCPs can be less effective if:   You forget to take the pill at the same time every day.   You have a stomach or intestinal disease that lessens the absorption of the pill.   You take OCPs with other medicines that make OCPs less effective, such as antibiotics, certain HIV medicines, and some seizure medicines.   You take expired OCPs.   You forget to restart the pill on day 7, when using the packs of 21 pills.  RISKS ASSOCIATED WITH ORAL CONTRACEPTIVE PILLS  Oral contraceptive pills can sometimes cause side effects, such as:  Headache.  Nausea.  Breast tenderness.  Irregular bleeding or spotting. Combination pills are also associated with a small increased risk of:  Blood clots.  Heart attack.  Stroke.   This information is not intended to replace advice given to you by your health care provider. Make sure you discuss any questions you have with your health care provider.   Document Released: 09/10/2002 Document Revised: 04/10/2013 Document Reviewed: 12/09/2012 Elsevier Interactive Patient Education 2016 Else[employer] Inc[employer]  EXERCISE AND DIET:  We recommended that you start or continue a regular exercise program for good health. Regular exercise means any   activity that makes your heart beat faster and makes you sweat.  We recommend exercising at least 30 minutes per day at least 3 days a week, preferably 4 or 5.  We also recommend a diet low in fat and sugar.  Inactivity, poor dietary choices and obesity can cause diabetes, heart attack, stroke, and  kidney damage, among others.    ALCOHOL AND SMOKING:  Women should limit their alcohol intake to no more than 7 drinks/beers/glasses of wine (combined, not each!) per week. Moderation of alcohol intake to this level decreases your risk of breast cancer and liver damage. And of course, no recreational drugs are part of a healthy lifestyle.  And absolutely no smoking or even second hand smoke. Most people know smoking can cause heart and lung diseases, but did you know it also contributes to weakening of your bones? Aging of your skin?  Yellowing of your teeth and nails?  CALCIUM AND VITAMIN D:  Adequate intake of calcium and Vitamin D are recommended.  The recommendations for exact amounts of these supplements seem to change often, but generally speaking 600 mg of calcium (either carbonate or citrate) and 800 units of Vitamin D per day seems prudent. Certain women may benefit from higher intake of Vitamin D.  If you are among these women, your doctor will have told you during your visit.    PAP SMEARS:  Pap smears, to check for cervical cancer or precancers,  have traditionally been done yearly, although recent scientific advances have shown that most women can have pap smears less often.  However, every woman still should have a physical exam from her gynecologist every year. It will include a breast check, inspection of the vulva and vagina to check for abnormal growths or skin changes, a visual exam of the cervix, and then an exam to evaluate the size and shape of the uterus and ovaries.  And after 26 years of age, a rectal exam is indicated to check for rectal cancers. We will also provide age appropriate advice regarding health maintenance, like when you should have certain vaccines, screening for sexually transmitted diseases, bone density testing, colonoscopy, mammograms, etc.   MAMMOGRAMS:  All women over 40 years old should have a yearly mammogram. Many facilities now offer a "3D" mammogram, which  may cost around $50 extra out of pocket. If possible,  we recommend you accept the option to have the 3D mammogram performed.  It both reduces the number of women who will be called back for extra views which then turn out to be normal, and it is better than the routine mammogram at detecting truly abnormal areas.    COLONOSCOPY:  Colonoscopy to screen for colon cancer is recommended for all women at age 50.  We know, you hate the idea of the prep.  We agree, BUT, having colon cancer and not knowing it is worse!!  Colon cancer so often starts as a polyp that can be seen and removed at colonscopy, which can quite literally save your life!  And if your first colonoscopy is normal and you have no family history of colon cancer, most women don't have to have it again for 10 years.  Once every ten years, you can do something that may end up saving your life, right?  We will be happy to help you get it scheduled when you are ready.  Be sure to check your insurance coverage so you understand how much it will cost.  It may be covered   as a preventative service at no cost, but you should check your particular policy.       

## 2015-11-13 LAB — IPS PAP TEST WITH REFLEX TO HPV

## 2015-11-16 ENCOUNTER — Telehealth: Payer: Self-pay | Admitting: Obstetrics and Gynecology

## 2015-11-16 ENCOUNTER — Telehealth: Payer: Self-pay | Admitting: *Deleted

## 2015-11-16 LAB — IPS N GONORRHOEA AND CHLAMYDIA BY PCR

## 2015-11-16 NOTE — Telephone Encounter (Signed)
-----   Message from Romualdo BolkJill Evelyn Jertson, MD sent at 11/16/2015  2:04 PM EDT ----- Please advise the patient of normal results.

## 2015-11-16 NOTE — Telephone Encounter (Signed)
Patient is aware of results. -eh 

## 2015-11-16 NOTE — Telephone Encounter (Signed)
Patient calling to check the status of her fmla paperwork.

## 2015-11-16 NOTE — Telephone Encounter (Signed)
Kennon RoundsSally- this patient called and said that she was informed today that her FMLA was denied from her surgery because we did not respond with-in the time frame. She is a [employer] employee and it is through [employer]. Can you please look into this   FAX number for [employer] - [phone]

## 2015-11-16 NOTE — Telephone Encounter (Signed)
[employer] -eh  

## 2015-11-16 NOTE — Telephone Encounter (Signed)
Patient returned call

## 2015-11-17 NOTE — Telephone Encounter (Signed)
See additional encounter from 11-16-15. [employer] faxed forms on 11-03-15 at 3:54pm. There was no due date noted on forms or supplied by patient. Usually office turnaround time is 2 weeks. Contacted [employer] by business office, forms submitted and they will reconsider. Call to patient, update provided. Advised to follow-up with [employer] later this week and notify me personally if still needs additional assistance.  Routing to provider for final review. Patient agreeable to disposition. Will close encounter.

## 2015-11-17 NOTE — Telephone Encounter (Signed)
FMLA forms faxed to office on 11-03-15 at 1554, with no instruction on due date. Standard turnaround time ( in our office) for forms completion is two weeks without specific request otherwise. See account notes for contact with [employer].

## 2015-11-18 ENCOUNTER — Other Ambulatory Visit: Payer: Federal, State, Local not specified - PPO

## 2015-11-24 ENCOUNTER — Ambulatory Visit
Admission: RE | Admit: 2015-11-24 | Discharge: 2015-11-24 | Disposition: A | Discharge status: Home / Self Care | Payer: Federal, State, Local not specified - PPO | Source: Ambulatory Visit | Attending: Obstetrics and Gynecology | Admitting: Obstetrics and Gynecology

## 2015-11-24 ENCOUNTER — Other Ambulatory Visit (INDEPENDENT_AMBULATORY_CARE_PROVIDER_SITE_OTHER): Payer: Federal, State, Local not specified - PPO

## 2015-11-24 ENCOUNTER — Other Ambulatory Visit: Payer: Self-pay | Admitting: Obstetrics and Gynecology

## 2015-11-24 DIAGNOSIS — D649 Anemia, unspecified: Secondary | ICD-10-CM

## 2015-11-24 DIAGNOSIS — Z113 Encounter for screening for infections with a predominantly sexual mode of transmission: Secondary | ICD-10-CM

## 2015-11-24 DIAGNOSIS — N6313 Unspecified lump in the right breast, lower outer quadrant: Secondary | ICD-10-CM

## 2015-11-24 LAB — CBC WITH DIFFERENTIAL/PLATELET
BASOS ABS: 33 {cells}/uL (ref 0–200)
Basophils Relative: 1 %
EOS ABS: 165 {cells}/uL (ref 15–500)
Eosinophils Relative: 5 %
HEMATOCRIT: 30.8 % — AB (ref 35.0–45.0)
Hemoglobin: 9.6 g/dL — ABNORMAL LOW (ref 11.7–15.5)
LYMPHS PCT: 46 %
Lymphs Abs: 1518 cells/uL (ref 850–3900)
MCH: 24.6 pg — AB (ref 27.0–33.0)
MCHC: 31.2 g/dL — AB (ref 32.0–36.0)
MCV: 78.8 fL — AB (ref 80.0–100.0)
MONO ABS: 330 {cells}/uL (ref 200–950)
MONOS PCT: 10 %
MPV: 9.6 fL (ref 7.5–12.5)
NEUTROS PCT: 38 %
Neutro Abs: 1254 cells/uL — ABNORMAL LOW (ref 1500–7800)
PLATELETS: 334 10*3/uL (ref 140–400)
RBC: 3.91 MIL/uL (ref 3.80–5.10)
RDW: 15.4 % — AB (ref 11.0–15.0)
WBC: 3.3 10*3/uL — ABNORMAL LOW (ref 3.8–10.8)

## 2015-11-25 ENCOUNTER — Other Ambulatory Visit: Payer: Self-pay | Admitting: Obstetrics and Gynecology

## 2015-11-25 LAB — STD PANEL
HIV 1&2 Ab, 4th Generation: NONREACTIVE
Hepatitis B Surface Ag: NEGATIVE

## 2015-11-25 LAB — HEPATITIS C ANTIBODY: HCV Ab: NEGATIVE

## 2015-11-26 LAB — IRON AND TIBC
%SAT: 8 % — ABNORMAL LOW (ref 11–50)
Iron: 28 ug/dL — ABNORMAL LOW (ref 40–190)
TIBC: 357 ug/dL (ref 250–450)
UIBC: 329 ug/dL (ref 125–400)

## 2015-11-26 LAB — FERRITIN: FERRITIN: 4 ng/mL — AB (ref 10–154)

## 2015-12-02 ENCOUNTER — Telehealth: Payer: Self-pay | Admitting: Emergency Medicine

## 2015-12-02 DIAGNOSIS — D649 Anemia, unspecified: Secondary | ICD-10-CM

## 2015-12-02 NOTE — Telephone Encounter (Signed)
Notes Recorded by Romualdo BolkJill Evelyn Jertson, MD on 11/25/2015 at 3:22 PM Please inform the patient that her STD blood work was normal. She is still anemic, no improvement in the last month. Please confirm she has been taking her iron. If so, please set up an appointment with hematology for evaluation and possible iron transfusion. I spoke with Baird Lyonsasey, she will add iron studies onto her blood work from yesterday.

## 2015-12-02 NOTE — Telephone Encounter (Signed)
-----   Message from Romualdo BolkJill Evelyn Jertson, MD sent at 11/26/2015  2:10 PM EDT ----- Please inform iron studies were low. I don't think she was informed of her prior labs yet. Please see note and inform. She needs an appointment with hematology. thanks

## 2015-12-02 NOTE — Telephone Encounter (Signed)
Call to patient and she is given STD results and lab results with iron studies. She states she was taking her iron but has not been taking it as regularly as possible, she is fasting for Ramadan so she is not taking any medications at this time. She accepts referral for consult with hematology.   She denies complaints, no dizziness, no weakness, not lightheaded.  Verbalized understanding of results, will restart iron as diet allows and will call back with any concerns. [facility] at [facility] placed.  cc Lilyan GilfordBecky Frahm for referral .  Routing to provider for final review. Patient agreeable to disposition. Will close encounter.

## 2015-12-09 ENCOUNTER — Other Ambulatory Visit (HOSPITAL_BASED_OUTPATIENT_CLINIC_OR_DEPARTMENT_OTHER): Payer: Federal, State, Local not specified - PPO

## 2015-12-09 ENCOUNTER — Ambulatory Visit (HOSPITAL_BASED_OUTPATIENT_CLINIC_OR_DEPARTMENT_OTHER): Payer: Federal, State, Local not specified - PPO | Admitting: Hematology

## 2015-12-09 ENCOUNTER — Telehealth: Payer: Self-pay | Admitting: Hematology

## 2015-12-09 ENCOUNTER — Encounter: Payer: Self-pay | Admitting: Hematology

## 2015-12-09 VITALS — BP 107/60 | HR 64 | Temp 98.6°F | Resp 17 | Ht 60.0 in | Wt 115.1 lb

## 2015-12-09 DIAGNOSIS — D509 Iron deficiency anemia, unspecified: Secondary | ICD-10-CM | POA: Diagnosis not present

## 2015-12-09 DIAGNOSIS — E538 Deficiency of other specified B group vitamins: Secondary | ICD-10-CM | POA: Diagnosis not present

## 2015-12-09 LAB — CBC & DIFF AND RETIC
BASO%: 0.4 % (ref 0.0–2.0)
BASOS ABS: 0 10*3/uL (ref 0.0–0.1)
EOS%: 3.3 % (ref 0.0–7.0)
Eosinophils Absolute: 0.2 10*3/uL (ref 0.0–0.5)
HEMATOCRIT: 29.7 % — AB (ref 34.8–46.6)
HGB: 9.9 g/dL — ABNORMAL LOW (ref 11.6–15.9)
IMMATURE RETIC FRACT: 2 % (ref 1.60–10.00)
LYMPH#: 2 10*3/uL (ref 0.9–3.3)
LYMPH%: 39.4 % (ref 14.0–49.7)
MCH: 25.4 pg (ref 25.1–34.0)
MCHC: 33.3 g/dL (ref 31.5–36.0)
MCV: 76.3 fL — ABNORMAL LOW (ref 79.5–101.0)
MONO#: 0.4 10*3/uL (ref 0.1–0.9)
MONO%: 8.3 % (ref 0.0–14.0)
NEUT#: 2.5 10*3/uL (ref 1.5–6.5)
NEUT%: 48.6 % (ref 38.4–76.8)
PLATELETS: 286 10*3/uL (ref 145–400)
RBC: 3.89 10*6/uL (ref 3.70–5.45)
RDW: 14.9 % — AB (ref 11.2–14.5)
RETIC %: 1.63 % (ref 0.70–2.10)
RETIC CT ABS: 63.41 10*3/uL (ref 33.70–90.70)
WBC: 5.2 10*3/uL (ref 3.9–10.3)

## 2015-12-09 LAB — COMPREHENSIVE METABOLIC PANEL
ALT: 9 U/L (ref 0–55)
ANION GAP: 7 meq/L (ref 3–11)
AST: 14 U/L (ref 5–34)
Albumin: 3.8 g/dL (ref 3.5–5.0)
Alkaline Phosphatase: 50 U/L (ref 40–150)
BILIRUBIN TOTAL: 0.43 mg/dL (ref 0.20–1.20)
BUN: 10.5 mg/dL (ref 7.0–26.0)
CHLORIDE: 107 meq/L (ref 98–109)
CO2: 24 meq/L (ref 22–29)
CREATININE: 0.7 mg/dL (ref 0.6–1.1)
Calcium: 9.2 mg/dL (ref 8.4–10.4)
EGFR: >90 mL/min/{1.73_m2} (ref >90)
Glucose: 73 mg/dl (ref 70–140)
Potassium: 3.7 mEq/L (ref 3.5–5.1)
Sodium: 139 mEq/L (ref 136–145)
TOTAL PROTEIN: 7.9 g/dL (ref 6.4–8.3)

## 2015-12-09 MED ORDER — POLYSACCHARIDE IRON COMPLEX 150 MG PO CAPS: 150.0000 mg | ORAL_CAPSULE | Freq: Two times a day (BID) | ORAL | Status: DC

## 2015-12-09 MED ORDER — THERA VITAL M PO TABS: 1.0000 | ORAL_TABLET | Freq: Every day | ORAL | Status: DC

## 2015-12-09 NOTE — Telephone Encounter (Signed)
Gave pt apt & avs °

## 2015-12-09 NOTE — Progress Notes (Signed)
Marland Kitchen    HEMATOLOGY/ONCOLOGY CONSULTATION NOTE  Date of Service: 12/09/2015  Patient Care Team: No Pcp Per Patient as PCP - General (General Practice)  CHIEF COMPLAINTS/PURPOSE OF CONSULTATION:  Iron deficiency Anemia  HISTORY OF PRESENTING ILLNESS:   Kathryn [name] is a wonderful 25 y.o. female who has been referred to [place] by Dr Gertie Exon for evaluation and management of iron deficiency anemia.  Patient is a Engineer, civil (consulting) at [employer] and has had no significant past medical history. She was apparently in her usual state of health when on 10/20/2015 she presented with bilateral lower quadrant abdominal pain that progressively worsened. Pain then was predominantly in her right upper quadrant. She also had a syncopal episode the same day. She had no fevers chills nausea and emesis or diarrhea and no urinary symptoms. She had a right upper quadrant abdominal ultrasound that showed intra-abdominal fluid and a pelvic ultrasound that showed a probable ovarian cyst. She had a diagnostic laparoscopy for ruptured likely bleeding ovarian cyst. No overt active bleeding was noted from the cyst. 900 mL of blood was suctioned out from the peritoneal cavity. No other source of bleeding was noted. Bowel and omentum were normal. Spleen and liver were noted to be normal. It wasn't clear understanding of the etiology of her spontaneous intraperitoneal bleed with significant blood loss. Dr. Oscar La her GYN doctor did not feel that this was from her ovarian cyst.  Patient was stabilized and had no further issues with bleeding. Patient had a CT of the abdomen and pelvis with contrast on 10/21/2015 which showed no source of her hemoperitoneum. No evidence of active bleeding or persistent hemoperitoneum.  Patient was discharged home in stable condition.   She has been subsequently seen in clinic by Dr.Jertson and was noted to have yeast vaginitis-treated with Diflucan. She was also noted to have a right breast lump  which on her ultrasound appeared to be a small complex cyst or small fibroadenoma.   After her spontaneous hemoperitoneum her hemoglobin was down to 7.9 postoperatively with MCV of 76.9 on 10/24/2015. She followed up in clinic and had repeat labs done on 10/27/00/2017  Which are a hemoglobin of 9.5 with an MCV of 78.6 and ferritin level of 11. CBC on 11/24/2015 showed a hemoglobin of 9.6 with an MCV of 78.8 and a ferritin of 4. Patient was not taking her oral iron as per recommendations due to concerns with some mild constipation.  She was referred to [place] for evaluation and management of her iron deficiency anemia. She reports that her periods have not been too heavy and last 5-6 days off which 2 days or heavier. She notes that she has fair amount of dysmenorrhea and cramping with her periods but does not feel that they are too heavy.  No other evidence of overt bleeding. Her stools have been normal with no evidence of bleeding. No epistaxis. No hematuria.  Some fatigue but no significant lightheadedness or dizziness at this time.    MEDICAL HISTORY:  Past Medical History  Diagnosis Date  . Medical history non-contributory     SURGICAL HISTORY: Past Surgical History  Procedure Laterality Date  . No past surgeries    . Laparoscopy N/A 10/20/2015    Procedure: LAPAROSCOPY DIAGNOSTIC, DRAINAGE OF RIGHT OVARIAN CYST;  Surgeon: Romualdo Bolk, MD;  Location: [facility];  Service: Gynecology;  Laterality: N/A;  . Pelvic laparoscopy      SOCIAL HISTORY: Social History   Social History  . Marital Status:  Single    Spouse Name: N/A  . Number of Children: N/A  . Years of Education: N/A   Occupational History  . Not on file.   Social History Main Topics  . Smoking status: Never Smoker   . Smokeless tobacco: Never Used  . Alcohol Use: No  . Drug Use: No  . Sexual Activity: Yes    Birth Control/ Protection: Condom   Other Topics Concern  . Not on file   Social History Narrative     FAMILY HISTORY: Family History  Problem Relation Age of Onset  . Hypertension Father     ALLERGIES:  has No Known Allergies.  MEDICATIONS:  Current Outpatient Prescriptions  Medication Sig Dispense Refill  . Garlic 1000 MG CAPS Take 2 capsules by mouth daily as needed (for cold symptoms).    Marland Kitchen. ibuprofen (ADVIL,MOTRIN) 200 MG tablet Take 400 mg by mouth every 6 (six) hours as needed for mild pain or moderate pain.     No current facility-administered medications for this visit.    REVIEW OF SYSTEMS:    10 Point review of Systems was done is negative except as noted above.  PHYSICAL EXAMINATION: ECOG PERFORMANCE STATUS: 0 - Asymptomatic  . Filed Vitals:   12/09/15 1350  BP: 107/60  Pulse: 64  Temp: 98.6 F (37 C)  Resp: 17   Filed Weights   12/09/15 1350  Weight: 115 lb 1.6 oz (52.209 kg)   .Body mass index is 22.48 kg/(m^2).  GENERAL:alert, in no acute distress and comfortable SKIN: skin color, texture, turgor are normal, no rashes or significant lesions EYES: normal, conjunctiva are pink and non-injected, sclera clear OROPHARYNX:no exudate, no erythema and lips, buccal mucosa, and tongue normal  NECK: supple, no JVD, thyroid normal size, non-tender, without nodularity LYMPH:  no palpable lymphadenopathy in the cervical, axillary or inguinal LUNGS: clear to auscultation with normal respiratory effort HEART: regular rate & rhythm,  no murmurs and no lower extremity edema ABDOMEN: abdomen soft, non-tender, normoactive bowel sounds. Completely healed laparoscopic incisions .No palpable hepatosplenomegalyal: no cyanosis of digits and no clubbing  PSYCH: alert & oriented x 3 with fluent speech NEURO: no focal motor/sensory deficits  LABORATORY DATA:  I have reviewed the data as listed  . CBC Latest Ref Rng 12/09/2015 11/24/2015 10/28/2015  WBC 3.9 - 10.3 10e3/uL 5.2 3.3(L) 5.0  Hemoglobin 11.6 - 15.9 g/dL 8.6(V9.9(L) 7.8(I9.6(L) 6.9(G9.5(L)  Hematocrit 34.8 - 46.6 % 29.7(L)  30.8(L) 29.7(L)  Platelets 145 - 400 10e3/uL 286 334 450(H)   . CBC    Component Value Date/Time   WBC 5.2 12/09/2015 1458   WBC 3.3* 11/24/2015 1420   RBC 3.89 12/09/2015 1458   RBC 3.91 11/24/2015 1420   HGB 9.9* 12/09/2015 1458   HGB 9.6* 11/24/2015 1420   HCT 29.7* 12/09/2015 1458   HCT 30.8* 11/24/2015 1420   PLT 286 12/09/2015 1458   PLT 334 11/24/2015 1420   MCV 76.3* 12/09/2015 1458   MCV 78.8* 11/24/2015 1420   MCH 25.4 12/09/2015 1458   MCH 24.6* 11/24/2015 1420   MCHC 33.3 12/09/2015 1458   MCHC 31.2* 11/24/2015 1420   RDW 14.9* 12/09/2015 1458   RDW 15.4* 11/24/2015 1420   LYMPHSABS 2.0 12/09/2015 1458   LYMPHSABS 1518 11/24/2015 1420   MONOABS 0.4 12/09/2015 1458   MONOABS 330 11/24/2015 1420   EOSABS 0.2 12/09/2015 1458   EOSABS 165 11/24/2015 1420   BASOSABS 0.0 12/09/2015 1458   BASOSABS 33 11/24/2015 1420     .  CMP Latest Ref Rng 12/09/2015 10/23/2015 10/21/2015  Glucose 70 - 140 mg/dl 73 99 409(W)  BUN 7.0 - 26.0 mg/dL 11.9 <1(Y) <7(W)  Creatinine 0.6 - 1.1 mg/dL 0.7 2.95 6.21  Sodium 308 - 145 mEq/L 139 140 140  Potassium 3.5 - 5.1 mEq/L 3.7 3.9 3.6  Chloride 101 - 111 mmol/L - 110 113(H)  CO2 22 - 29 mEq/L 24 24 21(L)  Calcium 8.4 - 10.4 mg/dL 9.2 6.5(H) 8.4(O)  Total Protein 6.4 - 8.3 g/dL 7.9 - 6.5  Total Bilirubin 0.20 - 1.20 mg/dL 9.62 - 0.6  Alkaline Phos 40 - 150 U/L 50 - 40  AST 5 - 34 U/L 14 - 16  ALT 0 - 55 U/L 9 - 12(L)   . Lab Results  Component Value Date   IRON 41 12/09/2015   TIBC 355 12/09/2015   IRONPCTSAT 12* 12/09/2015   (Iron and TIBC)  Lab Results  Component Value Date   FERRITIN 6* 12/09/2015   B12 369   RADIOGRAPHIC STUDIES: I have personally reviewed the radiological images as listed and agreed with the findings in the report. US Breast Ltd Uni Right Inc Axilla  11/24/2015  CLINICAL DATA:  Patient presents for evaluation of palpable abnormality within the inferior right breast. EXAM: ULTRASOUND OF THE RIGHT  BREAST COMPARISON:  None FINDINGS: On physical exam, I palpate a small mobile mass within the inferior periareolar right breast. Targeted ultrasound is performed, showing a 3 x 2 x 3 mm oval circumscribed hypoechoic mass within the subcutaneous right breast 6 o'clock position 1 cm from the nipple. IMPRESSION: Hypoechoic right breast mass may correspond with palpable abnormality, potentially representing a small complicated cyst. A small fibroadenoma could have a similar appearance. RECOMMENDATION: Right breast ultrasound in 3 months to assess for interval change in probably benign right breast mass. I have discussed the findings and recommendations with the patient. Results were also provided in writing at the conclusion of the visit. If applicable, a reminder letter will be sent to the patient regarding the next appointment. BI-RADS CATEGORY  3: Probably benign. Electronically Signed   By: Annia Belt M.D.   On: 11/24/2015 15:54    ASSESSMENT & PLAN:   25 year old otherwise healthy [place] Female with   1) iron deficiency anemia. Likely related to significant blood loss related to her spontaneous hemoperitoneum in April 2017. Probably also has ongoing blood loss with periods. Notes no overt dietary restrictions. Eats meat. No significant issues with anemia in the past. Has not been taking her oral iron as ordered. No known GI issues that might contribution to issues with iron absorption.  PLAN -Patient's hemoglobin is relatively stable over the last month and the hemoglobin today is 9.9. She is still noted to be significantly iron deficient. -She hasn't had an adequate trial of oral iron at this time and was offered the option of trying oral iron for about a month to check for response and tolerance vs repleting her iron with IV iron at this time. -After discussion of the pros and cons of either approach patient is okay with trying oral iron . Liter with iron polysaccharide 150 mg by mouth twice a  day with orange juice . -If she has increasing symptoms of anemia or cannot tolerate the oral Iron or does not respond to the oral iron we shall move on to replacement with IV iron .  -Recommended she use Colace as a stool softeners for iron related constipation .  Return to care  in 4 weeks with Dr. Candise Che with repeat CBC, CMP, ferritin   All of the patients questions were answered with apparent satisfaction. The patient knows to call the clinic with any problems, questions or concerns.  I spent 40 minutes counseling the patient face to face. The total time spent in the appointment was 45 minutes and more than 50% was on counseling and direct patient cares.    Wyvonnia Lora MD MS AAHIVMS [facility] Hematology/Oncology Physician [facility]  (Office):       [phone] (Work cell):  [phone] (Fax):           [phone]  12/09/2015 2:05 PM

## 2015-12-10 LAB — VITAMIN B12: VITAMIN B 12: 369 pg/mL (ref 211–946)

## 2015-12-10 LAB — FERRITIN: FERRITIN: 6 ng/mL — AB (ref 9–269)

## 2015-12-10 LAB — IRON AND TIBC
%SAT: 12 % — AB (ref 21–57)
Iron: 41 ug/dL (ref 41–142)
TIBC: 355 ug/dL (ref 236–444)
UIBC: 314 ug/dL (ref 120–384)

## 2015-12-14 ENCOUNTER — Telehealth: Payer: Self-pay | Admitting: Obstetrics and Gynecology

## 2015-12-14 NOTE — Telephone Encounter (Signed)
She is scheduled for a short term f/u ultrasound. Please keep her on mammogram hold until we get that report.

## 2015-12-14 NOTE — Telephone Encounter (Signed)
Patient cancelled her breast recheck on 12/17/15 because she says she does not need to come in. She says she has a follow up appointment already with the cancer center.

## 2015-12-17 ENCOUNTER — Ambulatory Visit: Payer: Federal, State, Local not specified - PPO | Admitting: Obstetrics and Gynecology

## 2015-12-25 NOTE — Telephone Encounter (Signed)
3 Month Recall entered. Continued hold per Dr. Oscar LaJertson.

## 2016-01-06 ENCOUNTER — Encounter: Payer: Self-pay | Admitting: Hematology

## 2016-01-06 ENCOUNTER — Other Ambulatory Visit (HOSPITAL_BASED_OUTPATIENT_CLINIC_OR_DEPARTMENT_OTHER): Payer: Federal, State, Local not specified - PPO

## 2016-01-06 ENCOUNTER — Other Ambulatory Visit: Payer: Self-pay | Admitting: *Deleted

## 2016-01-06 ENCOUNTER — Ambulatory Visit (HOSPITAL_BASED_OUTPATIENT_CLINIC_OR_DEPARTMENT_OTHER): Payer: Federal, State, Local not specified - PPO | Admitting: Hematology

## 2016-01-06 ENCOUNTER — Telehealth: Payer: Self-pay | Admitting: Hematology

## 2016-01-06 VITALS — BP 125/68 | HR 85 | Resp 20 | Ht 60.0 in | Wt 114.2 lb

## 2016-01-06 DIAGNOSIS — D509 Iron deficiency anemia, unspecified: Secondary | ICD-10-CM

## 2016-01-06 DIAGNOSIS — E538 Deficiency of other specified B group vitamins: Secondary | ICD-10-CM

## 2016-01-06 LAB — COMPREHENSIVE METABOLIC PANEL
ALK PHOS: 52 U/L (ref 40–150)
ALT: 12 U/L (ref 0–55)
ANION GAP: 8 meq/L (ref 3–11)
AST: 14 U/L (ref 5–34)
Albumin: 3.8 g/dL (ref 3.5–5.0)
BILIRUBIN TOTAL: 0.48 mg/dL (ref 0.20–1.20)
BUN: 15.6 mg/dL (ref 7.0–26.0)
CALCIUM: 9.3 mg/dL (ref 8.4–10.4)
CHLORIDE: 106 meq/L (ref 98–109)
CO2: 24 mEq/L (ref 22–29)
CREATININE: 0.8 mg/dL (ref 0.6–1.1)
EGFR: >90 mL/min/{1.73_m2} (ref >90)
Glucose: 94 mg/dl (ref 70–140)
Potassium: 4.1 mEq/L (ref 3.5–5.1)
Sodium: 138 mEq/L (ref 136–145)
TOTAL PROTEIN: 8 g/dL (ref 6.4–8.3)

## 2016-01-06 LAB — CBC WITH DIFFERENTIAL/PLATELET
BASO%: 0.7 % (ref 0.0–2.0)
BASOS ABS: 0 10*3/uL (ref 0.0–0.1)
EOS%: 3.4 % (ref 0.0–7.0)
Eosinophils Absolute: 0.2 10*3/uL (ref 0.0–0.5)
HEMATOCRIT: 35.8 % (ref 34.8–46.6)
HGB: 11.9 g/dL (ref 11.6–15.9)
LYMPH#: 2.9 10*3/uL (ref 0.9–3.3)
LYMPH%: 46.6 % (ref 14.0–49.7)
MCH: 26.2 pg (ref 25.1–34.0)
MCHC: 33.4 g/dL (ref 31.5–36.0)
MCV: 78.4 fL — ABNORMAL LOW (ref 79.5–101.0)
MONO#: 0.5 10*3/uL (ref 0.1–0.9)
MONO%: 7.3 % (ref 0.0–14.0)
NEUT#: 2.7 10*3/uL (ref 1.5–6.5)
NEUT%: 42 % (ref 38.4–76.8)
PLATELETS: 259 10*3/uL (ref 145–400)
RBC: 4.57 10*6/uL (ref 3.70–5.45)
RDW: 17.9 % — ABNORMAL HIGH (ref 11.2–14.5)
WBC: 6.3 10*3/uL (ref 3.9–10.3)

## 2016-01-06 LAB — FERRITIN: FERRITIN: 10 ng/mL (ref 9–269)

## 2016-01-06 MED ORDER — POLYSACCHARIDE IRON COMPLEX 150 MG PO CAPS: 150.0000 mg | ORAL_CAPSULE | Freq: Every day | ORAL | Status: DC

## 2016-01-06 NOTE — Telephone Encounter (Signed)
Gave pt cal & avs °

## 2016-01-07 NOTE — Progress Notes (Signed)
Kathryn [name] Kitchen.    HEMATOLOGY/ONCOLOGY CLINIC NOTE  Date of Service: .01/06/2016  Patient Care Team: No Pcp Per Patient as PCP - General (General Practice)  CHIEF COMPLAINTS/PURPOSE OF CONSULTATION:  Iron deficiency Anemia  HISTORY OF PRESENTING ILLNESS:   Kathryn [name] is a wonderful 25 y.o. female who has been referred to us by Dr Gertie ExonJill Jertson for evaluation and management of iron deficiency anemia.  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 She was apparently in her usual state of health when on 10/20/2015 she presented with bilateral lower quadrant abdominal pain that progressively worsened. Pain then was predominantly in her right upper quadrant. She also had a syncopal episode the same day. She had no fevers chills nausea and emesis or diarrhea and no urinary symptoms. She had a right upper quadrant abdominal ultrasound that showed intra-abdominal fluid and a pelvic ultrasound that showed a probable ovarian cyst. She had a diagnostic laparoscopy for ruptured likely bleeding ovarian cyst. No overt active bleeding was noted from the cyst. 900 mL of blood was suctioned out from the peritoneal cavity. No other source of bleeding was noted. Bowel and omentum were normal. Spleen and liver were noted to be normal. It wasn't clear understanding of the etiology of her spontaneous intraperitoneal bleed with significant blood loss. Dr. Oscar LaJertson her GYN doctor did not feel that this was from her ovarian cyst.  Patient was stabilized and had no further issues with bleeding. Patient had a CT of the abdomen and pelvis with contrast on 10/21/2015 which showed no source of her hemoperitoneum. No evidence of active bleeding or persistent hemoperitoneum.  Patient was discharged home in stable condition.   She has been subsequently seen in clinic by Dr.Jertson and was noted to have yeast vaginitis-treated with Diflucan. She was also noted to have a right breast lump which  on her ultrasound appeared to be a small complex cyst or small fibroadenoma.   After her spontaneous hemoperitoneum her hemoglobin was down to 7.9 postoperatively with MCV of 76.9 on 10/24/2015. She followed up in clinic and had repeat labs done on 10/27/00/2017  Which are a hemoglobin of 9.5 with an MCV of 78.6 and ferritin level of 11. CBC on 11/24/2015 showed a hemoglobin of 9.6 with an MCV of 78.8 and a ferritin of 4. Patient was not taking her oral iron as per recommendations due to concerns with some mild constipation.  She was referred to us for evaluation and management of her iron deficiency anemia. She reports that her periods have not been too heavy and last 5-6 days off which 2 days or heavier. She notes that she has fair amount of dysmenorrhea and cramping with her periods but does not feel that they are too heavy.  No other evidence of overt bleeding. Her stools have been normal with no evidence of bleeding. No epistaxis. No hematuria.  Some fatigue but no significant lightheadedness or dizziness at this time.    INTERVAL HISTORY  Ms Kathryn [name] is here for her scheduled followup. She notes that the oral iron does make her feel constipated but she is managing to take it alongwith stool softners. Her hgb has improved from 9.9 to 11.9 and she notes her energy levels have improved significantly. She notes that her menstrual periods have been lighter lasting only 3 days since her surgery. She notes no other overt blood loss. No overt rectal bleeding/hematuria. No abdominal pain or discomfort.  MEDICAL HISTORY:  Past Medical  History  Diagnosis Date  . Medical history non-contributory     SURGICAL HISTORY: Past Surgical History  Procedure Laterality Date  . No past surgeries    . Laparoscopy N/A 10/20/2015    Procedure: LAPAROSCOPY DIAGNOSTIC, DRAINAGE OF RIGHT OVARIAN CYST;  Surgeon: Romualdo Bolk, MD;  Location: [facility];  Service: Gynecology;  Laterality: N/A;  . Pelvic  laparoscopy      SOCIAL HISTORY: Social History   Social History  . Marital Status: Single    Spouse Name: N/A  . Number of Children: N/A  . Years of Education: N/A   Occupational History  . Not on file.   Social History Main Topics  . Smoking status: Never Smoker   . Smokeless tobacco: Never Used  . Alcohol Use: No  . Drug Use: No  . Sexual Activity: Yes    Birth Control/ Protection: Condom   Other Topics Concern  . Not on file   Social History Narrative    FAMILY HISTORY: Family History  Problem Relation Age of Onset  . Hypertension Father     ALLERGIES:  has No Known Allergies.  MEDICATIONS:  Current Outpatient Prescriptions  Medication Sig Dispense Refill  . Garlic 1000 MG CAPS Take 2 capsules by mouth daily as needed (for cold symptoms).    . iron polysaccharides (NIFEREX) 150 MG capsule Take 1 capsule (150 mg total) by mouth daily. With orange juice 60 capsule 3  . Multiple Vitamins-Minerals (MULTIVITAMIN) tablet Take 1 tablet by mouth daily.     No current facility-administered medications for this visit.    REVIEW OF SYSTEMS:    10 Point review of Systems was done is negative except as noted above.  PHYSICAL EXAMINATION: ECOG PERFORMANCE STATUS: 0 - Asymptomatic  . Filed Vitals:   01/06/16 0920  BP: 125/68  Pulse: 85  Resp: 20   Filed Weights   01/06/16 0920  Weight: 114 lb 3.2 oz (51.801 kg)   .Body mass index is 22.3 kg/(m^2).  GENERAL:alert, in no acute distress and comfortable SKIN: skin color, texture, turgor are normal, no rashes or significant lesions EYES: normal, conjunctiva are pink and non-injected, sclera clear OROPHARYNX:no exudate, no erythema and lips, buccal mucosa, and tongue normal  NECK: supple, no JVD, thyroid normal size, non-tender, without nodularity LYMPH:  no palpable lymphadenopathy in the cervical, axillary or inguinal LUNGS: clear to auscultation with normal respiratory effort HEART: regular rate & rhythm,   no murmurs and no lower extremity edema ABDOMEN: abdomen soft, non-tender, normoactive bowel sounds. Completely healed laparoscopic incisions .No palpable hepatosplenomegalyal: no cyanosis of digits and no clubbing  PSYCH: alert & oriented x 3 with fluent speech NEURO: no focal motor/sensory deficits  LABORATORY DATA:  I have reviewed the data as listed  . CBC Latest Ref Rng 01/06/2016 12/09/2015 11/24/2015  WBC 3.9 - 10.3 10e3/uL 6.3 5.2 3.3(L)  Hemoglobin 11.6 - 15.9 g/dL 16.1 0.9(U) 0.4(V)  Hematocrit 34.8 - 46.6 % 35.8 29.7(L) 30.8(L)  Platelets 145 - 400 10e3/uL 259 286 334   . CBC    Component Value Date/Time   WBC 6.3 01/06/2016 0909   WBC 3.3* 11/24/2015 1420   RBC 4.57 01/06/2016 0909   RBC 3.91 11/24/2015 1420   HGB 11.9 01/06/2016 0909   HGB 9.6* 11/24/2015 1420   HCT 35.8 01/06/2016 0909   HCT 30.8* 11/24/2015 1420   PLT 259 01/06/2016 0909   PLT 334 11/24/2015 1420   MCV 78.4* 01/06/2016 0909   MCV 78.8* 11/24/2015 1420  MCH 26.2 01/06/2016 0909   MCH 24.6* 11/24/2015 1420   MCHC 33.4 01/06/2016 0909   MCHC 31.2* 11/24/2015 1420   RDW 17.9* 01/06/2016 0909   RDW 15.4* 11/24/2015 1420   LYMPHSABS 2.9 01/06/2016 0909   LYMPHSABS 1518 11/24/2015 1420   MONOABS 0.5 01/06/2016 0909   MONOABS 330 11/24/2015 1420   EOSABS 0.2 01/06/2016 0909   EOSABS 165 11/24/2015 1420   BASOSABS 0.0 01/06/2016 0909   BASOSABS 33 11/24/2015 1420     . CMP Latest Ref Rng 01/06/2016 12/09/2015 10/23/2015  Glucose 70 - 140 mg/dl 94 73 99  BUN 7.0 - 16.126.0 mg/dL 09.615.6 04.510.5 <4(U<5(L)  Creatinine 0.6 - 1.1 mg/dL 0.8 0.7 9.810.47  Sodium 191136 - 145 mEq/L 138 139 140  Potassium 3.5 - 5.1 mEq/L 4.1 3.7 3.9  Chloride 101 - 111 mmol/L - - 110  CO2 22 - 29 mEq/L 24 24 24   Calcium 8.4 - 10.4 mg/dL 9.3 9.2 4.7(W8.6(L)  Total Protein 6.4 - 8.3 g/dL 8.0 7.9 -  Total Bilirubin 0.20 - 1.20 mg/dL 2.950.48 6.210.43 -  Alkaline Phos 40 - 150 U/L 52 50 -  AST 5 - 34 U/L 14 14 -  ALT 0 - 55 U/L 12 9 -   . Lab Results    Component Value Date   IRON 41 12/09/2015   TIBC 355 12/09/2015   IRONPCTSAT 12* 12/09/2015   (Iron and TIBC)  Lab Results  Component Value Date   FERRITIN 10 01/06/2016   B12 369   RADIOGRAPHIC STUDIES: I have personally reviewed the radiological images as listed and agreed with the findings in the report. No results found.  ASSESSMENT & PLAN:   25 year old otherwise healthy [place] Female with   1) iron deficiency anemia. Likely related to significant blood loss related to her spontaneous hemoperitoneum in April 2017. Probably also has ongoing blood loss with periods. Notes no overt dietary restrictions. Eats meat. No significant issues with anemia in the past. Has not been taking her oral iron as ordered. No known GI issues that might contribution to issues with iron absorption.  Patient has been on po iron for about a month and her Hgb has improved from 9.9 to 11.9 suggesting good response to po iron with near resolution of anemia Ferritin still only 10 and she will need to build iron stores.  2) Constipation related to po iron. Managed with OTC stool softners. PLAN -patient is feeling better with near resolution of her anemia -she will need to continue to take PO iron still she has develop adequate iron stores with ferritin of atleast 50. -due to GI issues and improved symptoms we shall continue po Iron polysaccharide at 150mg  po daily instead of BID. -continue mulitvitamin   Return to care in 4 months with Dr. Candise CheKale with repeat CBC, CMP, ferritin   All of the patients questions were answered with apparent satisfaction. The patient knows to call the clinic with any problems, questions or concerns.  I spent 15 minutes counseling the patient face to face. The total time spent in the appointment was 20 minutes and more than 50% was on counseling and direct patient cares.    Wyvonnia LoraGautam Kale MD MS AAHIVMS [facility] Hematology/Oncology Physician [facility]  (Office):       [phone] (Work cell):  [phone] (Fax):           [phone]

## 2016-03-08 ENCOUNTER — Telehealth: Payer: Self-pay | Admitting: *Deleted

## 2016-03-08 NOTE — Telephone Encounter (Signed)
Patient in 04 recall for a 3 month breast U/S. Left patient a message as it does not look like there is an appointment in Epic.

## 2016-03-21 NOTE — Telephone Encounter (Signed)
Left another message for patient to call regarding 04 recall -eh 

## 2016-03-29 NOTE — Telephone Encounter (Signed)
Spoke with patient and she has been really busy. She says she will call and schedule her follow up today. Will extend recall for 1 month -eh

## 2016-05-02 ENCOUNTER — Other Ambulatory Visit: Payer: Self-pay | Admitting: Obstetrics and Gynecology

## 2016-05-02 DIAGNOSIS — N6310 Unspecified lump in the right breast, unspecified quadrant: Secondary | ICD-10-CM

## 2016-05-09 ENCOUNTER — Other Ambulatory Visit: Payer: Federal, State, Local not specified - PPO

## 2016-05-09 ENCOUNTER — Ambulatory Visit: Payer: Federal, State, Local not specified - PPO | Admitting: Hematology

## 2016-05-19 ENCOUNTER — Ambulatory Visit
Admission: RE | Admit: 2016-05-19 | Discharge: 2016-05-19 | Disposition: A | Discharge status: Home / Self Care | Payer: Federal, State, Local not specified - PPO | Source: Ambulatory Visit | Attending: Obstetrics and Gynecology | Admitting: Obstetrics and Gynecology

## 2016-05-19 ENCOUNTER — Telehealth: Payer: Self-pay | Admitting: Hematology

## 2016-05-19 ENCOUNTER — Other Ambulatory Visit (HOSPITAL_BASED_OUTPATIENT_CLINIC_OR_DEPARTMENT_OTHER): Payer: Federal, State, Local not specified - PPO

## 2016-05-19 ENCOUNTER — Encounter: Payer: Self-pay | Admitting: Hematology

## 2016-05-19 ENCOUNTER — Telehealth: Payer: Self-pay | Admitting: Obstetrics and Gynecology

## 2016-05-19 ENCOUNTER — Ambulatory Visit (HOSPITAL_BASED_OUTPATIENT_CLINIC_OR_DEPARTMENT_OTHER): Payer: Federal, State, Local not specified - PPO | Admitting: Hematology

## 2016-05-19 VITALS — BP 124/73 | HR 92 | Temp 98.4°F | Resp 18 | Wt 119.3 lb

## 2016-05-19 DIAGNOSIS — N6310 Unspecified lump in the right breast, unspecified quadrant: Secondary | ICD-10-CM

## 2016-05-19 DIAGNOSIS — D509 Iron deficiency anemia, unspecified: Secondary | ICD-10-CM

## 2016-05-19 DIAGNOSIS — D500 Iron deficiency anemia secondary to blood loss (chronic): Secondary | ICD-10-CM

## 2016-05-19 DIAGNOSIS — R5383 Other fatigue: Secondary | ICD-10-CM

## 2016-05-19 DIAGNOSIS — N63 Unspecified lump in unspecified breast: Secondary | ICD-10-CM | POA: Diagnosis not present

## 2016-05-19 LAB — COMPREHENSIVE METABOLIC PANEL
ALK PHOS: 56 U/L (ref 40–150)
ALT: 11 U/L (ref 0–55)
ANION GAP: 8 meq/L (ref 3–11)
AST: 13 U/L (ref 5–34)
Albumin: 3.4 g/dL — ABNORMAL LOW (ref 3.5–5.0)
BILIRUBIN TOTAL: 0.61 mg/dL (ref 0.20–1.20)
BUN: 13.7 mg/dL (ref 7.0–26.0)
CO2: 22 meq/L (ref 22–29)
Calcium: 9 mg/dL (ref 8.4–10.4)
Chloride: 107 mEq/L (ref 98–109)
Creatinine: 0.8 mg/dL (ref 0.6–1.1)
Glucose: 141 mg/dl — ABNORMAL HIGH (ref 70–140)
Potassium: 4.3 mEq/L (ref 3.5–5.1)
Sodium: 138 mEq/L (ref 136–145)
TOTAL PROTEIN: 7.4 g/dL (ref 6.4–8.3)

## 2016-05-19 LAB — CBC & DIFF AND RETIC
BASO%: 0.5 % (ref 0.0–2.0)
Basophils Absolute: 0 10*3/uL (ref 0.0–0.1)
EOS%: 4.4 % (ref 0.0–7.0)
Eosinophils Absolute: 0.3 10*3/uL (ref 0.0–0.5)
HCT: 33.6 % — ABNORMAL LOW (ref 34.8–46.6)
HGB: 11.5 g/dL — ABNORMAL LOW (ref 11.6–15.9)
IMMATURE RETIC FRACT: 2.1 % (ref 1.60–10.00)
LYMPH%: 51 % — AB (ref 14.0–49.7)
MCH: 27.6 pg (ref 25.1–34.0)
MCHC: 34.2 g/dL (ref 31.5–36.0)
MCV: 80.6 fL (ref 79.5–101.0)
MONO#: 0.4 10*3/uL (ref 0.1–0.9)
MONO%: 7.2 % (ref 0.0–14.0)
NEUT%: 36.9 % — ABNORMAL LOW (ref 38.4–76.8)
NEUTROS ABS: 2.1 10*3/uL (ref 1.5–6.5)
PLATELETS: 261 10*3/uL (ref 145–400)
RBC: 4.17 10*6/uL (ref 3.70–5.45)
RDW: 13.4 % (ref 11.2–14.5)
Retic %: 1.54 % (ref 0.70–2.10)
Retic Ct Abs: 64.22 10*3/uL (ref 33.70–90.70)
WBC: 5.7 10*3/uL (ref 3.9–10.3)
lymph#: 2.9 10*3/uL (ref 0.9–3.3)
nRBC: 0 % (ref 0–0)

## 2016-05-19 LAB — FERRITIN: Ferritin: 17 ng/ml (ref 9–269)

## 2016-05-19 NOTE — Telephone Encounter (Signed)
Patient had breast ultrasound today and they had no signed order.  They are requesting a signed order be sent to them.  [facility] fax # [phone]

## 2016-05-19 NOTE — Telephone Encounter (Signed)
Gave patient avs report and appointments for November thru January  °

## 2016-05-19 NOTE — Telephone Encounter (Signed)
Dr.Jertson, order placed for right breast ultrasound for 3 month follow up of right breast mass. Please review and sign electronically.

## 2016-05-27 ENCOUNTER — Other Ambulatory Visit: Payer: Self-pay | Admitting: Internal Medicine

## 2016-05-27 ENCOUNTER — Ambulatory Visit (HOSPITAL_BASED_OUTPATIENT_CLINIC_OR_DEPARTMENT_OTHER): Payer: Federal, State, Local not specified - PPO

## 2016-05-27 VITALS — BP 108/64 | HR 79 | Temp 98.3°F | Resp 18

## 2016-05-27 DIAGNOSIS — D509 Iron deficiency anemia, unspecified: Secondary | ICD-10-CM

## 2016-05-27 DIAGNOSIS — D500 Iron deficiency anemia secondary to blood loss (chronic): Secondary | ICD-10-CM

## 2016-05-27 MED ORDER — SODIUM CHLORIDE 0.9 % IV SOLN
Freq: Once | INTRAVENOUS | Status: AC
  Administered 2016-05-27: 09:00:00 via INTRAVENOUS

## 2016-05-27 MED ORDER — FERUMOXYTOL INJECTION 510 MG/17 ML
510.0000 mg | Freq: Once | INTRAVENOUS | Status: AC
  Administered 2016-05-27: 510 mg via INTRAVENOUS
  Filled 2016-05-27: qty 17

## 2016-05-27 NOTE — Patient Instructions (Signed)
Ferumoxytol injection What is this medicine? FERUMOXYTOL is an iron complex. Iron is used to make healthy red blood cells, which carry oxygen and nutrients throughout the body. This medicine is used to treat iron deficiency anemia in people with chronic kidney disease. COMMON BRAND NAME(S): Feraheme What should I tell my health care provider before I take this medicine? They need to know if you have any of these conditions: -anemia not caused by low iron levels -high levels of iron in the blood -magnetic resonance imaging (MRI) test scheduled -an unusual or allergic reaction to iron, other medicines, foods, dyes, or preservatives -pregnant or trying to get pregnant -breast-feeding How should I use this medicine? This medicine is for injection into a vein. It is given by a health care professional in a hospital or clinic setting. Talk to your pediatrician regarding the use of this medicine in children. Special care may be needed. What if I miss a dose? It is important not to miss your dose. Call your doctor or health care professional if you are unable to keep an appointment. What may interact with this medicine? This medicine may interact with the following medications: -other iron products What should I watch for while using this medicine? Visit your doctor or healthcare professional regularly. Tell your doctor or healthcare professional if your symptoms do not start to get better or if they get worse. You may need blood work done while you are taking this medicine. You may need to follow a special diet. Talk to your doctor. Foods that contain iron include: whole grains/cereals, dried fruits, beans, or peas, leafy green vegetables, and organ meats (liver, kidney). What side effects may I notice from receiving this medicine? Side effects that you should report to your doctor or health care professional as soon as possible: -allergic reactions like skin rash, itching or hives, swelling of the  face, lips, or tongue -breathing problems -changes in blood pressure -feeling faint or lightheaded, falls -fever or chills -flushing, sweating, or hot feelings -swelling of the ankles or feet Side effects that usually do not require medical attention (report to your doctor or health care professional if they continue or are bothersome): -diarrhea -headache -nausea, vomiting -stomach pain Where should I keep my medicine? This drug is given in a hospital or clinic and will not be stored at home.  2017 Elsevier/Gold Standard (2015-07-23 12:41:49)  

## 2016-06-03 ENCOUNTER — Ambulatory Visit (HOSPITAL_BASED_OUTPATIENT_CLINIC_OR_DEPARTMENT_OTHER): Payer: Federal, State, Local not specified - PPO

## 2016-06-03 VITALS — BP 103/57 | HR 64 | Temp 97.6°F | Resp 16

## 2016-06-03 DIAGNOSIS — D500 Iron deficiency anemia secondary to blood loss (chronic): Secondary | ICD-10-CM

## 2016-06-03 DIAGNOSIS — D509 Iron deficiency anemia, unspecified: Secondary | ICD-10-CM

## 2016-06-03 MED ORDER — SODIUM CHLORIDE 0.9 % IV SOLN
510.0000 mg | Freq: Once | INTRAVENOUS | Status: AC
  Administered 2016-06-03: 510 mg via INTRAVENOUS
  Filled 2016-06-03: qty 17

## 2016-06-03 MED ORDER — SODIUM CHLORIDE 0.9 % IV SOLN
Freq: Once | INTRAVENOUS | Status: AC
  Administered 2016-06-03: 09:00:00 via INTRAVENOUS

## 2016-06-03 NOTE — Patient Instructions (Signed)
Ferumoxytol injection What is this medicine? FERUMOXYTOL is an iron complex. Iron is used to make healthy red blood cells, which carry oxygen and nutrients throughout the body. This medicine is used to treat iron deficiency anemia in people with chronic kidney disease. COMMON BRAND NAME(S): Feraheme What should I tell my health care provider before I take this medicine? They need to know if you have any of these conditions: -anemia not caused by low iron levels -high levels of iron in the blood -magnetic resonance imaging (MRI) test scheduled -an unusual or allergic reaction to iron, other medicines, foods, dyes, or preservatives -pregnant or trying to get pregnant -breast-feeding How should I use this medicine? This medicine is for injection into a vein. It is given by a health care professional in a hospital or clinic setting. Talk to your pediatrician regarding the use of this medicine in children. Special care may be needed. What if I miss a dose? It is important not to miss your dose. Call your doctor or health care professional if you are unable to keep an appointment. What may interact with this medicine? This medicine may interact with the following medications: -other iron products What should I watch for while using this medicine? Visit your doctor or healthcare professional regularly. Tell your doctor or healthcare professional if your symptoms do not start to get better or if they get worse. You may need blood work done while you are taking this medicine. You may need to follow a special diet. Talk to your doctor. Foods that contain iron include: whole grains/cereals, dried fruits, beans, or peas, leafy green vegetables, and organ meats (liver, kidney). What side effects may I notice from receiving this medicine? Side effects that you should report to your doctor or health care professional as soon as possible: -allergic reactions like skin rash, itching or hives, swelling of the  face, lips, or tongue -breathing problems -changes in blood pressure -feeling faint or lightheaded, falls -fever or chills -flushing, sweating, or hot feelings -swelling of the ankles or feet Side effects that usually do not require medical attention (report to your doctor or health care professional if they continue or are bothersome): -diarrhea -headache -nausea, vomiting -stomach pain Where should I keep my medicine? This drug is given in a hospital or clinic and will not be stored at home.  2017 Elsevier/Gold Standard (2015-07-23 12:41:49)  

## 2016-06-13 NOTE — Progress Notes (Signed)
Marland Kitchen    HEMATOLOGY/ONCOLOGY CLINIC NOTE  Date of Service: .05/19/2016  Patient Care Team: No Pcp Per Patient as PCP - General (General Practice)  CHIEF COMPLAINTS/PURPOSE OF CONSULTATION:  Iron deficiency Anemia  HISTORY OF PRESENTING ILLNESS:   Kathryn [name] is a wonderful 25 y.o. female who has been referred to [place] by Dr Gertie Exon for evaluation and management of iron deficiency anemia.  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 She was apparently in her usual state of health when on 10/20/2015 she presented with bilateral lower quadrant abdominal pain that progressively worsened. Pain then was predominantly in her right upper quadrant. She also had a syncopal episode the same day. She had no fevers chills nausea and emesis or diarrhea and no urinary symptoms. She had a right upper quadrant abdominal ultrasound that showed intra-abdominal fluid and a pelvic ultrasound that showed a probable ovarian cyst. She had a diagnostic laparoscopy for ruptured likely bleeding ovarian cyst. No overt active bleeding was noted from the cyst. 900 mL of blood was suctioned out from the peritoneal cavity. No other source of bleeding was noted. Bowel and omentum were normal. Spleen and liver were noted to be normal. It wasn't clear understanding of the etiology of her spontaneous intraperitoneal bleed with significant blood loss. Dr. Oscar La her GYN doctor did not feel that this was from her ovarian cyst.  Patient was stabilized and had no further issues with bleeding. Patient had a CT of the abdomen and pelvis with contrast on 10/21/2015 which showed no source of her hemoperitoneum. No evidence of active bleeding or persistent hemoperitoneum.  Patient was discharged home in stable condition.   She has been subsequently seen in clinic by Dr.Jertson and was noted to have yeast vaginitis-treated with Diflucan. She was also noted to have a right breast lump which  on her ultrasound appeared to be a small complex cyst or small fibroadenoma.   After her spontaneous hemoperitoneum her hemoglobin was down to 7.9 postoperatively with MCV of 76.9 on 10/24/2015. She followed up in clinic and had repeat labs done on 10/27/00/2017  Which are a hemoglobin of 9.5 with an MCV of 78.6 and ferritin level of 11. CBC on 11/24/2015 showed a hemoglobin of 9.6 with an MCV of 78.8 and a ferritin of 4. Patient was not taking her oral iron as per recommendations due to concerns with some mild constipation.  She was referred to [place] for evaluation and management of her iron deficiency anemia. She reports that her periods have not been too heavy and last 5-6 days off which 2 days or heavier. She notes that she has fair amount of dysmenorrhea and cramping with her periods but does not feel that they are too heavy.  No other evidence of overt bleeding. Her stools have been normal with no evidence of bleeding. No epistaxis. No hematuria.  Some fatigue but no significant lightheadedness or dizziness at this time.    INTERVAL HISTORY  [name] is here for her scheduled followup. She notes no evidence of overt GI bleeding. Has some heavier periods. Has been unable to take her oral iron regularly due to GI discomfort. Ferritin still suggests significant iron deficiency. Still have a microcytic anemia. Notes significant hair loss and wonders if this is from Iron deficiency. 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 We discussed the pros and  cons of IV iron replacement and an informed verbal consent was obtained. Has an appointment for repeat right breast ultrasound as a 6 months follow-up for right breast lump. Ultrasound showed a 3 x 2 x 3 mm hypoechoic mass at the 6:00 position 1 cm from the nipple which is unchanged in size over the last 6 months.  MEDICAL HISTORY:  Past Medical History:  Diagnosis Date  .  Medical history non-contributory     SURGICAL HISTORY: Past Surgical History:  Procedure Laterality Date  . LAPAROSCOPY N/A 10/20/2015   Procedure: LAPAROSCOPY DIAGNOSTIC, DRAINAGE OF RIGHT OVARIAN CYST;  Surgeon: Romualdo Bolk, MD;  Location: [facility];  Service: Gynecology;  Laterality: N/A;  . NO PAST SURGERIES    . PELVIC LAPAROSCOPY      SOCIAL HISTORY: Social History   Social History  . Marital status: Single    Spouse name: N/A  . Number of children: N/A  . Years of education: N/A   Occupational History  . Not on file.   Social History Main Topics  . Smoking status: Never Smoker  . Smokeless tobacco: Never Used  . Alcohol use No  . Drug use: No  . Sexual activity: Yes    Birth control/ protection: Condom   Other Topics Concern  . Not on file   Social History Narrative  . No narrative on file    FAMILY HISTORY: Family History  Problem Relation Age of Onset  . Hypertension Father     ALLERGIES:  has No Known Allergies.  MEDICATIONS:  Current Outpatient Prescriptions  Medication Sig Dispense Refill  . Garlic 1000 MG CAPS Take 2 capsules by mouth daily as needed (for cold symptoms).    . iron polysaccharides (NIFEREX) 150 MG capsule Take 1 capsule (150 mg total) by mouth daily. With orange juice 60 capsule 3  . Multiple Vitamins-Minerals (MULTIVITAMIN) tablet Take 1 tablet by mouth daily.     No current facility-administered medications for this visit.     REVIEW OF SYSTEMS:    10 Point review of Systems was done is negative except as noted above.  PHYSICAL EXAMINATION: ECOG PERFORMANCE STATUS: 0 - Asymptomatic  . Vitals:   05/19/16 1000  BP: 124/73  Pulse: 92  Resp: 18  Temp: 98.4 F (36.9 C)   Filed Weights   05/19/16 1000  Weight: 119 lb 4.8 oz (54.1 kg)   .Body mass index is 23.3 kg/m.  GENERAL:alert, in no acute distress and comfortable SKIN: skin color, texture, turgor are normal, no rashes or significant lesions EYES:  normal, conjunctiva are pink and non-injected, sclera clear OROPHARYNX:no exudate, no erythema and lips, buccal mucosa, and tongue normal  NECK: supple, no JVD, thyroid normal size, non-tender, without nodularity LYMPH:  no palpable lymphadenopathy in the cervical, axillary or inguinal LUNGS: clear to auscultation with normal respiratory effort HEART: regular rate & rhythm,  no murmurs and no lower extremity edema ABDOMEN: abdomen soft, non-tender, normoactive bowel sounds. No palpable hepatosplenomegalyal: no cyanosis of digits and no clubbing. PSYCH: alert & oriented x 3 with fluent speech. NEURO: no focal motor/sensory deficits  LABORATORY DATA:  I have reviewed the data as listed  . CBC Latest Ref Rng & Units 05/19/2016 01/06/2016 12/09/2015  WBC 3.9 - 10.3 10e3/uL 5.7 6.3 5.2  Hemoglobin 11.6 - 15.9 g/dL 11.5(L) 11.9 9.9(L)  Hematocrit 34.8 - 46.6 % 33.6(L) 35.8 29.7(L)  Platelets 145 - 400 10e3/uL 261 259 286   . CBC    Component Value Date/Time  WBC 5.7 05/19/2016 0943   WBC 3.3 (L) 11/24/2015 1420   RBC 4.17 05/19/2016 0943   RBC 3.91 11/24/2015 1420   HGB 11.5 (L) 05/19/2016 0943   HCT 33.6 (L) 05/19/2016 0943   PLT 261 05/19/2016 0943   MCV 80.6 05/19/2016 0943   MCH 27.6 05/19/2016 0943   MCH 24.6 (L) 11/24/2015 1420   MCHC 34.2 05/19/2016 0943   MCHC 31.2 (L) 11/24/2015 1420   RDW 13.4 05/19/2016 0943   LYMPHSABS 2.9 05/19/2016 0943   MONOABS 0.4 05/19/2016 0943   EOSABS 0.3 05/19/2016 0943   BASOSABS 0.0 05/19/2016 0943     . CMP Latest Ref Rng & Units 05/19/2016 01/06/2016 12/09/2015  Glucose 70 - 140 mg/dl 161(W141(H) 94 73  BUN 7.0 - 26.0 mg/dL 96.013.7 45.415.6 09.810.5  Creatinine 0.6 - 1.1 mg/dL 0.8 0.8 0.7  Sodium 119136 - 145 mEq/L 138 138 139  Potassium 3.5 - 5.1 mEq/L 4.3 4.1 3.7  Chloride 101 - 111 mmol/L - - -  CO2 22 - 29 mEq/L 22 24 24   Calcium 8.4 - 10.4 mg/dL 9.0 9.3 9.2  Total Protein 6.4 - 8.3 g/dL 7.4 8.0 7.9  Total Bilirubin 0.20 - 1.20 mg/dL 1.470.61 8.290.48  5.620.43  Alkaline Phos 40 - 150 U/L 56 52 50  AST 5 - 34 U/L 13 14 14   ALT 0 - 55 U/L 11 12 9    . Lab Results  Component Value Date   IRON 41 12/09/2015   TIBC 355 12/09/2015   IRONPCTSAT 12 (L) 12/09/2015   (Iron and TIBC)  Lab Results  Component Value Date   FERRITIN 17 05/19/2016   B12 369   RADIOGRAPHIC STUDIES: I have personally reviewed the radiological images as listed and agreed with the findings in the report. [place] Breast Ltd Uni Right Inc Axilla  Result Date: 05/19/2016 CLINICAL DATA:  Six-month follow-up of a right breast mass EXAM: ULTRASOUND OF THE RIGHT BREAST COMPARISON:  Previous exam(s). FINDINGS: On physical exam, no suspicious lumps are identified. Targeted ultrasound is performed, showing a hypoechoic mass with increased through transmission at 6 o'clock, 1 cm from the nipple measuring 3 x 2 x 3 mm today versus 3 x 2 x 3 mm previously. IMPRESSION: Probably benign right breast mass, unchanged in the interval. RECOMMENDATION: Six-month follow-up ultrasound of the probably benign right breast mass. I have discussed the findings and recommendations with the patient. Results were also provided in writing at the conclusion of the visit. If applicable, a reminder letter will be sent to the patient regarding the next appointment. BI-RADS CATEGORY  3: Probably benign. Electronically Signed   By: Gerome Samavid  Williams III M.D   On: 05/19/2016 15:04    ASSESSMENT & PLAN:   25 year old otherwise healthy [place] Female with   1) Iron deficiency anemia. Likely related to significant blood loss related to her spontaneous hemoperitoneum in April 2017. Probably also has ongoing blood loss with periods. Notes no overt dietary restrictions. Eats meat. No significant issues with anemia in the past. No known GI issues that might contribution to issues with iron absorption. Patient's hemoglobin is down slightly at 11.5. Her ferritin is only at about 17. She notes significant issues with  intermittent complies with her oral iron due to GI discomfort and constipation and does not feel like she could increase her oral iron. 2) Intolerance to PO iron. Constipation and GI discomfort related to po iron. Has led to noncompliance with oral iron. 3) Fatigue  PLAN -Patient notes  she cannot really taken oral iron without significant GI issues and notes that she prefers to have outlined replaced IV. 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 -We received informed verbal consent for IV Feraheme every weekly 2 doses.  Return to care in 2 months with Dr. Candise CheKale with repeat CBC, CMP, ferritin   All of the patients questions were answered with apparent satisfaction. The patient knows to call the clinic with any problems, questions or concerns.  I spent 15 minutes counseling the patient face to face. The total time spent in the appointment was 20 minutes and more than 50% was on counseling and direct patient cares.    Wyvonnia LoraGautam Tyson Parkison MD MS AAHIVMS [facility] Hematology/Oncology Physician [facility]  (Office):       [phone] (Work cell):  [phone] (Fax):           [phone]

## 2016-07-14 ENCOUNTER — Ambulatory Visit (HOSPITAL_BASED_OUTPATIENT_CLINIC_OR_DEPARTMENT_OTHER): Payer: Federal, State, Local not specified - PPO | Admitting: Hematology

## 2016-07-14 ENCOUNTER — Other Ambulatory Visit (HOSPITAL_BASED_OUTPATIENT_CLINIC_OR_DEPARTMENT_OTHER): Payer: Federal, State, Local not specified - PPO

## 2016-07-14 ENCOUNTER — Encounter: Payer: Self-pay | Admitting: Hematology

## 2016-07-14 VITALS — BP 118/70 | HR 74 | Temp 98.6°F | Resp 18 | Ht 60.0 in | Wt 118.8 lb

## 2016-07-14 DIAGNOSIS — N63 Unspecified lump in unspecified breast: Secondary | ICD-10-CM

## 2016-07-14 DIAGNOSIS — D509 Iron deficiency anemia, unspecified: Secondary | ICD-10-CM

## 2016-07-14 DIAGNOSIS — D500 Iron deficiency anemia secondary to blood loss (chronic): Secondary | ICD-10-CM

## 2016-07-14 LAB — CBC & DIFF AND RETIC
BASO%: 0.4 % (ref 0.0–2.0)
Basophils Absolute: 0 10*3/uL (ref 0.0–0.1)
EOS%: 4.6 % (ref 0.0–7.0)
Eosinophils Absolute: 0.3 10*3/uL (ref 0.0–0.5)
HCT: 35.7 % (ref 34.8–46.6)
HGB: 12.6 g/dL (ref 11.6–15.9)
Immature Retic Fract: 2 % (ref 1.60–10.00)
LYMPH%: 51.1 % — AB (ref 14.0–49.7)
MCH: 28.9 pg (ref 25.1–34.0)
MCHC: 35.3 g/dL (ref 31.5–36.0)
MCV: 81.9 fL (ref 79.5–101.0)
MONO#: 0.4 10*3/uL (ref 0.1–0.9)
MONO%: 6 % (ref 0.0–14.0)
NEUT%: 37.9 % — ABNORMAL LOW (ref 38.4–76.8)
NEUTROS ABS: 2.7 10*3/uL (ref 1.5–6.5)
Platelets: 219 10*3/uL (ref 145–400)
RBC: 4.36 10*6/uL (ref 3.70–5.45)
RDW: 13.5 % (ref 11.2–14.5)
Retic %: 1.53 % (ref 0.70–2.10)
Retic Ct Abs: 66.71 10*3/uL (ref 33.70–90.70)
WBC: 7.2 10*3/uL (ref 3.9–10.3)
lymph#: 3.7 10*3/uL — ABNORMAL HIGH (ref 0.9–3.3)

## 2016-07-14 LAB — COMPREHENSIVE METABOLIC PANEL
ALT: 16 U/L (ref 0–55)
AST: 14 U/L (ref 5–34)
Albumin: 4 g/dL (ref 3.5–5.0)
Alkaline Phosphatase: 57 U/L (ref 40–150)
Anion Gap: 9 mEq/L (ref 3–11)
BUN: 16.7 mg/dL (ref 7.0–26.0)
CO2: 24 meq/L (ref 22–29)
Calcium: 9.8 mg/dL (ref 8.4–10.4)
Chloride: 106 mEq/L (ref 98–109)
Creatinine: 0.7 mg/dL (ref 0.6–1.1)
GLUCOSE: 78 mg/dL (ref 70–140)
POTASSIUM: 3.8 meq/L (ref 3.5–5.1)
SODIUM: 139 meq/L (ref 136–145)
Total Bilirubin: 0.57 mg/dL (ref 0.20–1.20)
Total Protein: 8.2 g/dL (ref 6.4–8.3)

## 2016-07-14 LAB — IRON AND TIBC
%SAT: 27 % (ref 21–57)
IRON: 64 ug/dL (ref 41–142)
TIBC: 235 ug/dL — AB (ref 236–444)
UIBC: 171 ug/dL (ref 120–384)

## 2016-07-14 LAB — FERRITIN: Ferritin: 398 ng/ml — ABNORMAL HIGH (ref 9–269)

## 2016-07-17 ENCOUNTER — Telehealth: Payer: Self-pay | Admitting: Hematology

## 2016-07-17 NOTE — Telephone Encounter (Signed)
Lvm advising appt for 7/12 @ 10am. Also mailed calendar.

## 2016-07-25 ENCOUNTER — Telehealth: Payer: Self-pay | Admitting: *Deleted

## 2016-07-25 NOTE — Telephone Encounter (Signed)
-----   Message from Johney MaineGautam Kishore Kale, MD sent at 07/25/2016  2:33 AM EST ----- Regarding: Iron Campbell StallLashonya, Could let the patient know her ferritin levels >100 so she could stop taking her po iron. Could continue daily multivitamin. thx GK

## 2016-07-25 NOTE — Telephone Encounter (Signed)
Left voicemail for patient

## 2016-07-25 NOTE — Progress Notes (Signed)
Marland Kitchen    HEMATOLOGY/ONCOLOGY CLINIC NOTE  Date of Service: .07/14/2016  Patient Care Team: No Pcp Per Patient as PCP - General (General Practice)  CHIEF COMPLAINTS/PURPOSE OF CONSULTATION:  Iron deficiency Anemia  HISTORY OF PRESENTING ILLNESS:   Kathryn [name] is a wonderful 26 y.o. female who has been referred to [place] by Dr Gertie Exon for evaluation and management of iron deficiency anemia.  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 She was apparently in her usual state of health when on 10/20/2015 she presented with bilateral lower quadrant abdominal pain that progressively worsened. Pain then was predominantly in her right upper quadrant. She also had a syncopal episode the same day. She had no fevers chills nausea and emesis or diarrhea and no urinary symptoms. She had a right upper quadrant abdominal ultrasound that showed intra-abdominal fluid and a pelvic ultrasound that showed a probable ovarian cyst. She had a diagnostic laparoscopy for ruptured likely bleeding ovarian cyst. No overt active bleeding was noted from the cyst. 900 mL of blood was suctioned out from the peritoneal cavity. No other source of bleeding was noted. Bowel and omentum were normal. Spleen and liver were noted to be normal. It wasn't clear understanding of the etiology of her spontaneous intraperitoneal bleed with significant blood loss. Dr. Oscar La her GYN doctor did not feel that this was from her ovarian cyst.  Patient was stabilized and had no further issues with bleeding. Patient had a CT of the abdomen and pelvis with contrast on 10/21/2015 which showed no source of her hemoperitoneum. No evidence of active bleeding or persistent hemoperitoneum.  Patient was discharged home in stable condition.   She has been subsequently seen in clinic by Dr.Jertson and was noted to have yeast vaginitis-treated with Diflucan. She was also noted to have a right breast lump which  on her ultrasound appeared to be a small complex cyst or small fibroadenoma.   After her spontaneous hemoperitoneum her hemoglobin was down to 7.9 postoperatively with MCV of 76.9 on 10/24/2015. She followed up in clinic and had repeat labs done on 10/27/00/2017  Which are a hemoglobin of 9.5 with an MCV of 78.6 and ferritin level of 11. CBC on 11/24/2015 showed a hemoglobin of 9.6 with an MCV of 78.8 and a ferritin of 4. Patient was not taking her oral iron as per recommendations due to concerns with some mild constipation.  She was referred to [place] for evaluation and management of her iron deficiency anemia. She reports that her periods have not been too heavy and last 5-6 days off which 2 days or heavier. She notes that she has fair amount of dysmenorrhea and cramping with her periods but does not feel that they are too heavy.  No other evidence of overt bleeding. Her stools have been normal with no evidence of bleeding. No epistaxis. No hematuria.  Some fatigue but no significant lightheadedness or dizziness at this time.    INTERVAL HISTORY  Ms Goosby is here for her scheduled followup for Iron deficiency anemia. She notes that she tolerated her IV iron well and had no issues with it. Feels her energy levels have improved and her hair loss has resolved. Notes no changes in her rt breast cyst. Has an appointment for repeat right breast ultrasound as a 6 months follow-up for right breast lump. No other acute new concerns.  MEDICAL HISTORY:  Past Medical History:  Diagnosis Date  . Medical history non-contributory  SURGICAL HISTORY: Past Surgical History:  Procedure Laterality Date  . LAPAROSCOPY N/A 10/20/2015   Procedure: LAPAROSCOPY DIAGNOSTIC, DRAINAGE OF RIGHT OVARIAN CYST;  Surgeon: Romualdo Bolk, MD;  Location: [facility];  Service: Gynecology;  Laterality: N/A;  . NO PAST SURGERIES    . PELVIC LAPAROSCOPY      SOCIAL HISTORY: Social History   Social History  . Marital  status: Single    Spouse name: N/A  . Number of children: N/A  . Years of education: N/A   Occupational History  . Not on file.   Social History Main Topics  . Smoking status: Never Smoker  . Smokeless tobacco: Never Used  . Alcohol use No  . Drug use: No  . Sexual activity: Yes    Birth control/ protection: Condom   Other Topics Concern  . Not on file   Social History Narrative  . No narrative on file    FAMILY HISTORY: Family History  Problem Relation Age of Onset  . Hypertension Father     ALLERGIES:  has No Known Allergies.  MEDICATIONS:  Current Outpatient Prescriptions  Medication Sig Dispense Refill  . Garlic 1000 MG CAPS Take 2 capsules by mouth daily as needed (for cold symptoms).    . iron polysaccharides (NIFEREX) 150 MG capsule Take 1 capsule (150 mg total) by mouth daily. With orange juice 60 capsule 3  . Multiple Vitamins-Minerals (MULTIVITAMIN) tablet Take 1 tablet by mouth daily.     No current facility-administered medications for this visit.     REVIEW OF SYSTEMS:    10 Point review of Systems was done is negative except as noted above.  PHYSICAL EXAMINATION: ECOG PERFORMANCE STATUS: 0 - Asymptomatic  . Vitals:   07/14/16 0949  BP: 118/70  Pulse: 74  Resp: 18  Temp: 98.6 F (37 C)   Filed Weights   07/14/16 0949  Weight: 118 lb 12.8 oz (53.9 kg)   .Body mass index is 23.2 kg/m.  GENERAL:alert, in no acute distress and comfortable SKIN: skin color, texture, turgor are normal, no rashes or significant lesions EYES: normal, conjunctiva are pink and non-injected, sclera clear OROPHARYNX:no exudate, no erythema and lips, buccal mucosa, and tongue normal  NECK: supple, no JVD, thyroid normal size, non-tender, without nodularity LYMPH:  no palpable lymphadenopathy in the cervical, axillary or inguinal LUNGS: clear to auscultation with normal respiratory effort HEART: regular rate & rhythm,  no murmurs and no lower extremity  edema ABDOMEN: abdomen soft, non-tender, normoactive bowel sounds. No palpable hepatosplenomegalyal: no cyanosis of digits and no clubbing. PSYCH: alert & oriented x 3 with fluent speech. NEURO: no focal motor/sensory deficits  LABORATORY DATA:  I have reviewed the data as listed  . CBC Latest Ref Rng & Units 07/14/2016 05/19/2016 01/06/2016  WBC 3.9 - 10.3 10e3/uL 7.2 5.7 6.3  Hemoglobin 11.6 - 15.9 g/dL 16.1 11.5(L) 11.9  Hematocrit 34.8 - 46.6 % 35.7 33.6(L) 35.8  Platelets 145 - 400 10e3/uL 219 261 259   . CBC    Component Value Date/Time   WBC 7.2 07/14/2016 0934   WBC 3.3 (L) 11/24/2015 1420   RBC 4.36 07/14/2016 0934   RBC 3.91 11/24/2015 1420   HGB 12.6 07/14/2016 0934   HCT 35.7 07/14/2016 0934   PLT 219 07/14/2016 0934   MCV 81.9 07/14/2016 0934   MCH 28.9 07/14/2016 0934   MCH 24.6 (L) 11/24/2015 1420   MCHC 35.3 07/14/2016 0934   MCHC 31.2 (L) 11/24/2015 1420   RDW  13.5 07/14/2016 0934   LYMPHSABS 3.7 (H) 07/14/2016 0934   MONOABS 0.4 07/14/2016 0934   EOSABS 0.3 07/14/2016 0934   BASOSABS 0.0 07/14/2016 0934     . CMP Latest Ref Rng & Units 07/14/2016 05/19/2016 01/06/2016  Glucose 70 - 140 mg/dl 78 454(U141(H) 94  BUN 7.0 - 26.0 mg/dL 98.116.7 19.113.7 47.815.6  Creatinine 0.6 - 1.1 mg/dL 0.7 0.8 0.8  Sodium 295136 - 145 mEq/L 139 138 138  Potassium 3.5 - 5.1 mEq/L 3.8 4.3 4.1  Chloride 101 - 111 mmol/L - - -  CO2 22 - 29 mEq/L 24 22 24   Calcium 8.4 - 10.4 mg/dL 9.8 9.0 9.3  Total Protein 6.4 - 8.3 g/dL 8.2 7.4 8.0  Total Bilirubin 0.20 - 1.20 mg/dL 6.210.57 3.080.61 6.570.48  Alkaline Phos 40 - 150 U/L 57 56 52  AST 5 - 34 U/L 14 13 14   ALT 0 - 55 U/L 16 11 12    . Lab Results  Component Value Date   IRON 64 07/14/2016   TIBC 235 (L) 07/14/2016   IRONPCTSAT 27 07/14/2016   (Iron and TIBC)  Lab Results  Component Value Date   FERRITIN 398 (H) 07/14/2016   B12 369   RADIOGRAPHIC STUDIES: I have personally reviewed the radiological images as listed and agreed with the  findings in the report. No results found.  ASSESSMENT & PLAN:   26 year old otherwise healthy Female with   1) Iron deficiency anemia. Likely related to significant blood loss related to her spontaneous hemoperitoneum in April 2017. Probably also has ongoing blood loss with periods. Notes no overt dietary restrictions. Eats meat. No significant issues with anemia in the past. No known GI issues that might contribution to issues with iron absorption. Patient's hemoglobin is down slightly at 11.5. Her ferritin is only at about 17. She notes significant issues with compliance with her oral iron due to GI discomfort and constipation and does not feel like she could increase her oral iron. Patient received IV Iron with resolution of her iron deficiency. 2) Intolerance to PO iron. Constipation and GI discomfort related to po iron. Has led to noncompliance with oral iron. 3) Fatigue -resolved 4) rt breast cyst PLAN -No indication for further IV iron at this time. -she can stop her PO Iron treatment at this time -reasonable to continue daily multivitamin -f/u for rpt breast US as scheduled in 6months   Return to care in 6 months with Dr. Candise CheKale with repeat CBC, CMP, ferritin   All of the patients questions were answered with apparent satisfaction. The patient knows to call the clinic with any problems, questions or concerns.  I spent 15 minutes counseling the patient face to face. The total time spent in the appointment was 15 minutes and more than 50% was on counseling and direct patient cares.    Wyvonnia LoraGautam Verdean Murin MD MS AAHIVMS [facility] Hematology/Oncology Physician [facility]  (Office):       [phone] (Work cell):  [phone] (Fax):           [phone]

## 2016-12-05 ENCOUNTER — Telehealth: Payer: Self-pay | Admitting: *Deleted

## 2016-12-05 NOTE — Telephone Encounter (Signed)
Spoke with patient regarding 04 recall. Patient will call and schedule. -eh

## 2017-01-09 NOTE — Telephone Encounter (Signed)
Left message to call- patient did not schedule follow up breast imaging. -eh

## 2017-01-10 ENCOUNTER — Telehealth: Payer: Self-pay | Admitting: Hematology

## 2017-01-10 NOTE — Telephone Encounter (Signed)
lvm about appointment changes with call back number °

## 2017-01-12 ENCOUNTER — Other Ambulatory Visit: Payer: Federal, State, Local not specified - PPO

## 2017-01-12 ENCOUNTER — Ambulatory Visit: Payer: Federal, State, Local not specified - PPO | Admitting: Hematology

## 2017-01-17 NOTE — Telephone Encounter (Signed)
Patient has not scheduled follow up breat imaging and has not returned calls regarding this. Please advise on recall status/letter Thanks

## 2017-01-20 NOTE — Telephone Encounter (Signed)
Please send her a letter about her missed f/u breast imaging. Then close the encounter

## 2017-01-23 ENCOUNTER — Encounter: Payer: Self-pay | Admitting: *Deleted

## 2017-01-23 NOTE — Telephone Encounter (Signed)
Letter sent and removed from recall -eh 

## 2017-01-30 ENCOUNTER — Encounter: Payer: Self-pay | Admitting: Hematology

## 2017-01-30 ENCOUNTER — Ambulatory Visit (HOSPITAL_BASED_OUTPATIENT_CLINIC_OR_DEPARTMENT_OTHER): Payer: No Typology Code available for payment source | Admitting: Hematology

## 2017-01-30 ENCOUNTER — Telehealth: Payer: Self-pay

## 2017-01-30 ENCOUNTER — Other Ambulatory Visit (HOSPITAL_BASED_OUTPATIENT_CLINIC_OR_DEPARTMENT_OTHER): Payer: No Typology Code available for payment source

## 2017-01-30 VITALS — BP 112/78 | HR 76 | Temp 98.6°F | Resp 16 | Ht 60.0 in | Wt 118.9 lb

## 2017-01-30 DIAGNOSIS — D509 Iron deficiency anemia, unspecified: Secondary | ICD-10-CM

## 2017-01-30 DIAGNOSIS — D500 Iron deficiency anemia secondary to blood loss (chronic): Secondary | ICD-10-CM

## 2017-01-30 LAB — CBC & DIFF AND RETIC
BASO%: 0.6 % (ref 0.0–2.0)
Basophils Absolute: 0 10*3/uL (ref 0.0–0.1)
EOS ABS: 0.3 10*3/uL (ref 0.0–0.5)
EOS%: 4.3 % (ref 0.0–7.0)
HCT: 36.7 % (ref 34.8–46.6)
HGB: 12.6 g/dL (ref 11.6–15.9)
IMMATURE RETIC FRACT: 1.8 % (ref 1.60–10.00)
LYMPH#: 3.3 10*3/uL (ref 0.9–3.3)
LYMPH%: 50.6 % — ABNORMAL HIGH (ref 14.0–49.7)
MCH: 28.9 pg (ref 25.1–34.0)
MCHC: 34.3 g/dL (ref 31.5–36.0)
MCV: 84.2 fL (ref 79.5–101.0)
MONO#: 0.4 10*3/uL (ref 0.1–0.9)
MONO%: 5.4 % (ref 0.0–14.0)
NEUT%: 39.1 % (ref 38.4–76.8)
NEUTROS ABS: 2.5 10*3/uL (ref 1.5–6.5)
Platelets: 256 10*3/uL (ref 145–400)
RBC: 4.36 10*6/uL (ref 3.70–5.45)
RDW: 12.7 % (ref 11.2–14.5)
RETIC CT ABS: 71.94 10*3/uL (ref 33.70–90.70)
Retic %: 1.65 % (ref 0.70–2.10)
WBC: 6.5 10*3/uL (ref 3.9–10.3)

## 2017-01-30 LAB — FERRITIN: FERRITIN: 191 ng/mL (ref 9–269)

## 2017-01-30 LAB — IRON AND TIBC
%SAT: 21 % (ref 21–57)
Iron: 51 ug/dL (ref 41–142)
TIBC: 248 ug/dL (ref 236–444)
UIBC: 196 ug/dL (ref 120–384)

## 2017-01-30 NOTE — Progress Notes (Signed)
Marland Kitchen.    HEMATOLOGY/ONCOLOGY CLINIC NOTE  Date of Service: .01/30/2017  Patient Care Team: Patient, No Pcp Per as PCP - General (General Practice)  CHIEF COMPLAINTS/PURPOSE OF CONSULTATION:  Iron deficiency Anemia  HISTORY OF PRESENTING ILLNESS:   Kathryn [name] is a wonderful 26 y.o. female who has been referred to us by Dr Gertie ExonJill Jertson for evaluation and management of iron deficiency anemia.  Patient is a Engineer, civil (consulting)nurse at [employer] and has had no significant past medical history. She was apparently in her usual state of health when on 10/20/2015 she presented with bilateral lower quadrant abdominal pain that progressively worsened. Pain then was predominantly in her right upper quadrant. She also had a syncopal episode the same day. She had no fevers chills nausea and emesis or diarrhea and no urinary symptoms. She had a right upper quadrant abdominal ultrasound that showed intra-abdominal fluid and a pelvic ultrasound that showed a probable ovarian cyst. She had a diagnostic laparoscopy for ruptured likely bleeding ovarian cyst. No overt active bleeding was noted from the cyst. 900 mL of blood was suctioned out from the peritoneal cavity. No other source of bleeding was noted. Bowel and omentum were normal. Spleen and liver were noted to be normal. It wasn't clear understanding of the etiology of her spontaneous intraperitoneal bleed with significant blood loss. Dr. Oscar LaJertson her GYN doctor did not feel that this was from her ovarian cyst.  Patient was stabilized and had no further issues with bleeding. Patient had a CT of the abdomen and pelvis with contrast on 10/21/2015 which showed no source of her hemoperitoneum. No evidence of active bleeding or persistent hemoperitoneum.  Patient was discharged home in stable condition.   She has been subsequently seen in clinic by Dr.Jertson and was noted to have yeast vaginitis-treated with Diflucan. She was also noted to have a right breast lump which  on her ultrasound appeared to be a small complex cyst or small fibroadenoma.   After her spontaneous hemoperitoneum her hemoglobin was down to 7.9 postoperatively with MCV of 76.9 on 10/24/2015. She followed up in clinic and had repeat labs done on 10/27/00/2017  Which are a hemoglobin of 9.5 with an MCV of 78.6 and ferritin level of 11. CBC on 11/24/2015 showed a hemoglobin of 9.6 with an MCV of 78.8 and a ferritin of 4. Patient was not taking her oral iron as per recommendations due to concerns with some mild constipation.  She was referred to us for evaluation and management of her iron deficiency anemia. She reports that her periods have not been too heavy and last 5-6 days off which 2 days or heavier. She notes that she has fair amount of dysmenorrhea and cramping with her periods but does not feel that they are too heavy.  No other evidence of overt bleeding. Her stools have been normal with no evidence of bleeding. No epistaxis. No hematuria.  Some fatigue but no significant lightheadedness or dizziness at this time.    INTERVAL HISTORY  Ms Kathryn [name] is here for her scheduled followup for Iron deficiency anemia. She notes her periods have been regular with no excessive bleeding. Notes that she had some dysuria and mild hematuria a month or so and had a UA which did not show any urinary tract infection. Discussed that she could have passed a small kidney stone. Currently having no urinary symptoms. Has some right mid back paraspinal pain and tenderness which is positional suggesting musculoskeletal etiology. No pain radiation.  MEDICAL HISTORY:  Past Medical History:  Diagnosis Date  . Medical history non-contributory     SURGICAL HISTORY: Past Surgical History:  Procedure Laterality Date  . LAPAROSCOPY N/A 10/20/2015   Procedure: LAPAROSCOPY DIAGNOSTIC, DRAINAGE OF RIGHT OVARIAN CYST;  Surgeon: Romualdo BolkJill Evelyn Jertson, MD;  Location: [facility];  Service: Gynecology;  Laterality: N/A;  . NO PAST  SURGERIES    . PELVIC LAPAROSCOPY      SOCIAL HISTORY: Social History   Social History  . Marital status: Single    Spouse name: N/A  . Number of children: N/A  . Years of education: N/A   Occupational History  . Not on file.   Social History Main Topics  . Smoking status: Never Smoker  . Smokeless tobacco: Never Used  . Alcohol use No  . Drug use: No  . Sexual activity: Yes    Birth control/ protection: Condom   Other Topics Concern  . Not on file   Social History Narrative  . No narrative on file    FAMILY HISTORY: Family History  Problem Relation Age of Onset  . Hypertension Father     ALLERGIES:  has No Known Allergies.  MEDICATIONS:  Current Outpatient Prescriptions  Medication Sig Dispense Refill  . Garlic 1000 MG CAPS Take 2 capsules by mouth daily as needed (for cold symptoms).    . Multiple Vitamins-Minerals (MULTIVITAMIN) tablet Take 1 tablet by mouth daily. (Patient not taking: Reported on 01/30/2017)     No current facility-administered medications for this visit.     REVIEW OF SYSTEMS:    10 Point review of Systems was done is negative except as noted above.  PHYSICAL EXAMINATION: ECOG PERFORMANCE STATUS: 0 - Asymptomatic  . Vitals:   01/30/17 1022  BP: 112/78  Pulse: 76  Resp: 16  Temp: 98.6 F (37 C)   Filed Weights   01/30/17 1022  Weight: 118 lb 14.4 oz (53.9 kg)   .Body mass index is 23.22 kg/m.  GENERAL:alert, in no acute distress and comfortable SKIN: skin color, texture, turgor are normal, no rashes or significant lesions EYES: normal, conjunctiva are pink and non-injected, sclera clear OROPHARYNX:no exudate, no erythema and lips, buccal mucosa, and tongue normal  NECK: supple, no JVD, thyroid normal size, non-tender, without nodularity LYMPH:  no palpable lymphadenopathy in the cervical, axillary or inguinal LUNGS: clear to auscultation with normal respiratory effort HEART: regular rate & rhythm,  no murmurs and no  lower extremity edema ABDOMEN: abdomen soft, non-tender, normoactive bowel sounds. No palpable hepatosplenomegalyal: no cyanosis of digits and no clubbing. PSYCH: alert & oriented x 3 with fluent speech. NEURO: no focal motor/sensory deficits  LABORATORY DATA:  I have reviewed the data as listed  . CBC Latest Ref Rng & Units 01/30/2017 07/14/2016 05/19/2016  WBC 3.9 - 10.3 10e3/uL 6.5 7.2 5.7  Hemoglobin 11.6 - 15.9 g/dL 40.912.6 81.112.6 11.5(L)  Hematocrit 34.8 - 46.6 % 36.7 35.7 33.6(L)  Platelets 145 - 400 10e3/uL 256 219 261   . CBC    Component Value Date/Time   WBC 6.5 01/30/2017 1000   WBC 3.3 (L) 11/24/2015 1420   RBC 4.36 01/30/2017 1000   RBC 3.91 11/24/2015 1420   HGB 12.6 01/30/2017 1000   HCT 36.7 01/30/2017 1000   PLT 256 01/30/2017 1000   MCV 84.2 01/30/2017 1000   MCH 28.9 01/30/2017 1000   MCH 24.6 (L) 11/24/2015 1420   MCHC 34.3 01/30/2017 1000   MCHC 31.2 (L) 11/24/2015 1420   RDW 12.7 01/30/2017  1000   LYMPHSABS 3.3 01/30/2017 1000   MONOABS 0.4 01/30/2017 1000   EOSABS 0.3 01/30/2017 1000   BASOSABS 0.0 01/30/2017 1000     . CMP Latest Ref Rng & Units 07/14/2016 05/19/2016 01/06/2016  Glucose 70 - 140 mg/dl 78 409(W) 94  BUN 7.0 - 26.0 mg/dL 11.9 14.7 82.9  Creatinine 0.6 - 1.1 mg/dL 0.7 0.8 0.8  Sodium 562 - 145 mEq/L 139 138 138  Potassium 3.5 - 5.1 mEq/L 3.8 4.3 4.1  Chloride 101 - 111 mmol/L - - -  CO2 22 - 29 mEq/L 24 22 24   Calcium 8.4 - 10.4 mg/dL 9.8 9.0 9.3  Total Protein 6.4 - 8.3 g/dL 8.2 7.4 8.0  Total Bilirubin 0.20 - 1.20 mg/dL 1.30 8.65 7.84  Alkaline Phos 40 - 150 U/L 57 56 52  AST 5 - 34 U/L 14 13 14   ALT 0 - 55 U/L 16 11 12    . Lab Results  Component Value Date   IRON 51 01/30/2017   TIBC 248 01/30/2017   IRONPCTSAT 21 01/30/2017   (Iron and TIBC)  Lab Results  Component Value Date   FERRITIN 191 01/30/2017   B12 369   RADIOGRAPHIC STUDIES: I have personally reviewed the radiological images as listed and agreed with  the findings in the report. No results found.  ASSESSMENT & PLAN:   26 year old otherwise healthy Female with   1) Iron deficiency anemia. Likely related to significant blood loss related to her spontaneous hemoperitoneum in April 2017. Probably also has ongoing blood loss with periods. Notes no overt dietary restrictions. Eats meat. No significant issues with anemia in the past. No known GI issues that might contribution to issues with iron absorption. Patient received IV Iron with resolution of her iron deficiency. Her ferritin levels today are at goal at 191 with 21% iron saturation. 2) Intolerance to PO iron. Constipation and GI discomfort related to po iron. Has led to noncompliance with oral iron. 3) Fatigue -resolved 4) rt breast cyst - being monitored by Dr Williemae Area -Patient has no anemia and her iron levels are stable. -No indication for further IV iron at this time. -she could take a daily multivitamin with iron for maintenance purposes. -Repeat labs with primary care physician in 4-6 months. -f/u for rpt breast US as per Dr. Nira Conn recommendations.  5) Mild Hairloss - does not appear to be related to Iron deficiency at this time. -Recommended daily multivitamin. -healthy protein rich diet. -Minimize hair products and treatment -Stress reduction and adequate sleep. -Additional workup as needed with primary care physician.  6) Rt mid back discomfort likely musculoskeletal -Warm packs -When necessary over-the-counter Tylenol. -If worsening symptoms should be evaluated further by primary care physician to rule out kidney stones or other etiologies.  Return to clinic with Dr. Candise Che at an as-needed basis if any new questions or concerns arise.  Return to care in 6 months with Dr. Candise Che with repeat CBC, CMP, ferritin   All of the patients questions were answered with apparent satisfaction. The patient knows to call the clinic with any problems, questions or  concerns.  I spent 15 minutes counseling the patient face to face. The total time spent in the appointment was 20 minutes and more than 50% was on counseling and direct patient cares.    Wyvonnia Lora MD MS AAHIVMS [facility] Hematology/Oncology Physician [facility]  (Office):       [phone] (Work cell):  [phone] (Fax):  [phone] 

## 2017-01-30 NOTE — Telephone Encounter (Signed)
Calling pt to relay ferritin result 191. Normalizing. Left message.

## 2017-01-30 NOTE — Patient Instructions (Signed)
Thank you for choosing [facility] to provide your oncology and hematology care.  To afford each patient quality time with our providers, please arrive 30 minutes before your scheduled appointment time.  If you arrive late for your appointment, you may be asked to reschedule.  We strive to give you quality time with our providers, and arriving late affects you and other patients whose appointments are after yours.  If you are a no show for multiple scheduled visits, you may be dismissed from the clinic at the providers discretion.   Again, thank you for choosing [facility], our hope is that these requests will decrease the amount of time that you wait before being seen by our physicians.  ______________________________________________________________________ Should you have questions after your visit to the [facility], please contact our office at [phone] between the hours of 8:30 and 4:30 p.m.    Voicemails left after 4:30p.m will not be returned until the following business day.   For prescription refill requests, please have your pharmacy contact us directly.  Please also try to allow 48 hours for prescription requests.   Please contact the scheduling department for questions regarding scheduling.  For scheduling of procedures such as PET scans, CT scans, MRI, Ultrasound, etc please contact central scheduling at [phone].   Resources For Cancer Patients and Caregivers:  American Cancer Society:  800-227-2345  Can help patients locate various types of support and financial assistance Cancer Care: 1-800-813-HOPE (4673) Provides financial assistance, online support groups, medication/co-pay assistance.   [place] DSS:  336-641-3447 Where to apply for food stamps, Medicaid, and utility assistance Medicare Rights Center: 800-333-4114 Helps people with Medicare understand their rights and benefits, navigate the Medicare system, and secure the  quality healthcare they deserve SCAT: 336-333-6589 [employer] [employer]'s shared-ride transportation service for eligible riders who have a disability that prevents them from riding the fixed route bus.   For additional information on assistance programs please contact our social worker:   Grier Hock/Abigail Elmore:  [phone] 

## 2017-02-09 IMAGING — CT CT ABD-PELV W/ CM
2 of 6 series · 16 of 46 positions shown, 18 images · IV contrast (iopamidol)
Comparison: Pelvic ultrasound yesterday.

CLINICAL DATA: Lower abdominal pain. Nontraumatic hemoperitoneum.
Patient post diagnostic laparoscopy 5 hours prior.

EXAM:
CT ABDOMEN AND PELVIS WITH CONTRAST
TECHNIQUE: Multidetector CT imaging of the abdomen and pelvis was performed
using the standard protocol following bolus administration of
intravenous contrast.
CONTRAST:  100mL [id number] IOPAMIDOL ([id number]) INJECTION 61%

[Series 2: rtn a/p with · axial · 0.60mm/px · z∈[+842,+1192]mm · 13 of 82 slices shown, 15 images]
[im 6/82  soft-tissue]
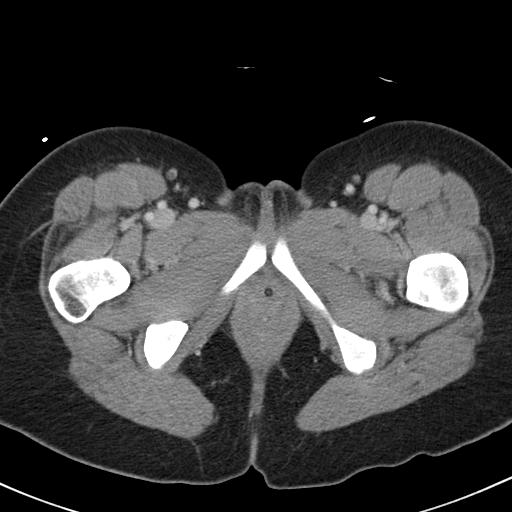
[im 6/82  bone]
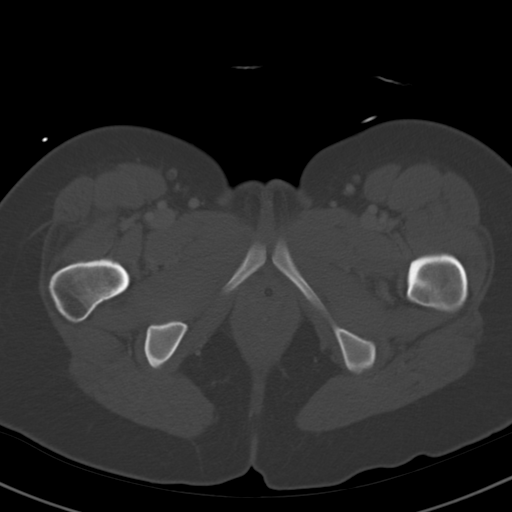
[im 11/82  soft-tissue]
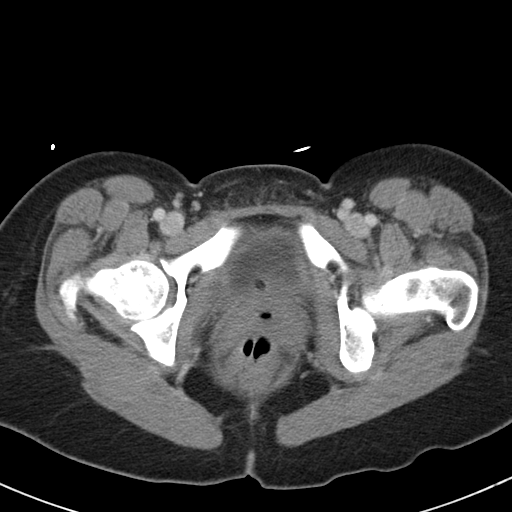
[im 16/82  soft-tissue]
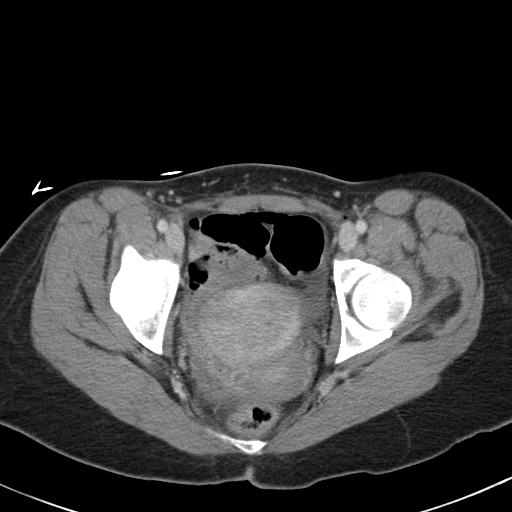
[im 26/82  soft-tissue]
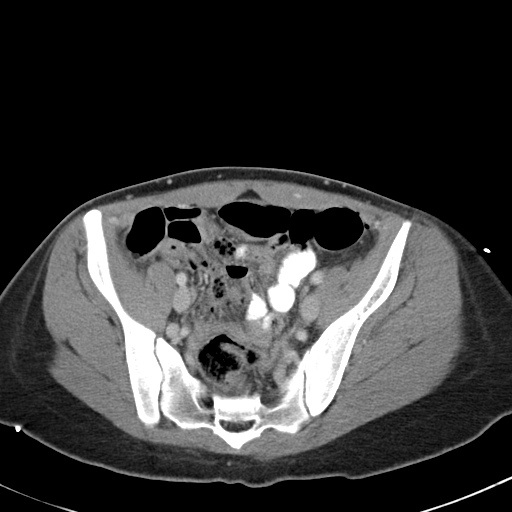
[im 31/82  soft-tissue]
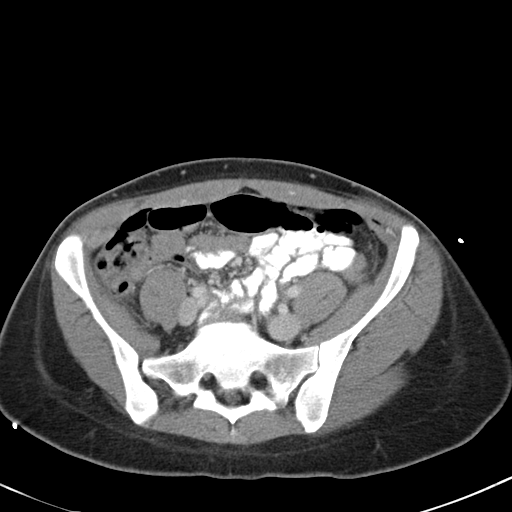
[im 36/82  soft-tissue]
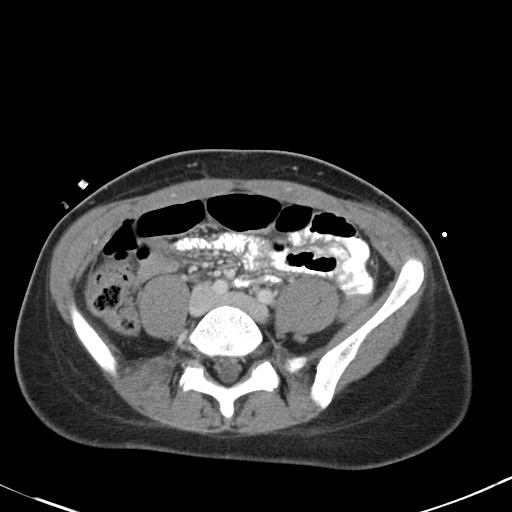
[im 41/82  soft-tissue]
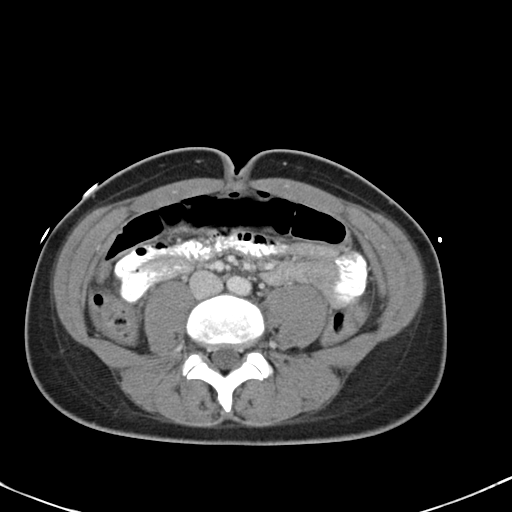
[im 46/82  soft-tissue]
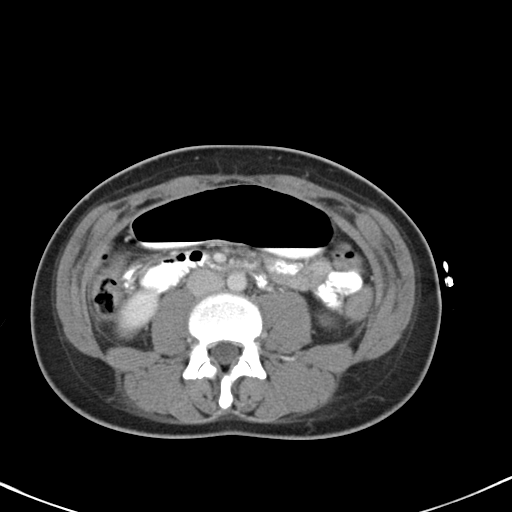
[im 51/82  soft-tissue]
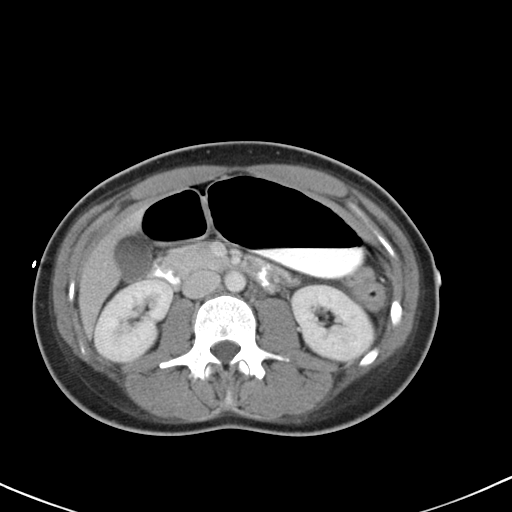
[im 51/82  bone]
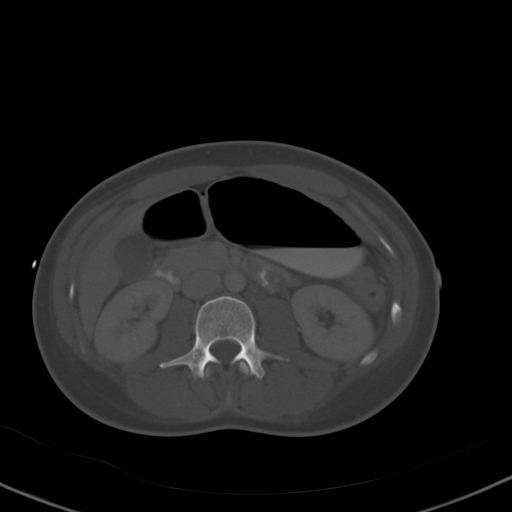
[im 56/82  soft-tissue]
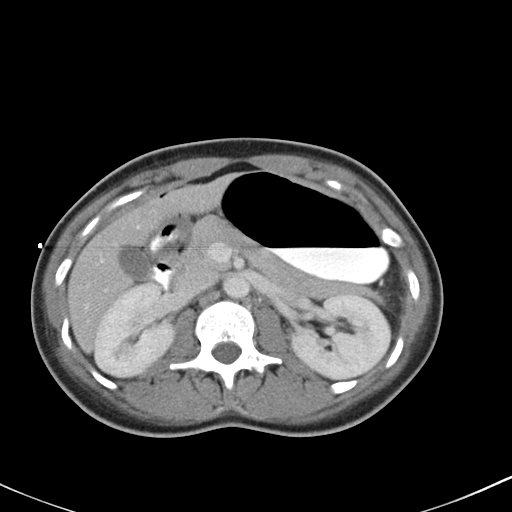
[im 66/82  soft-tissue]
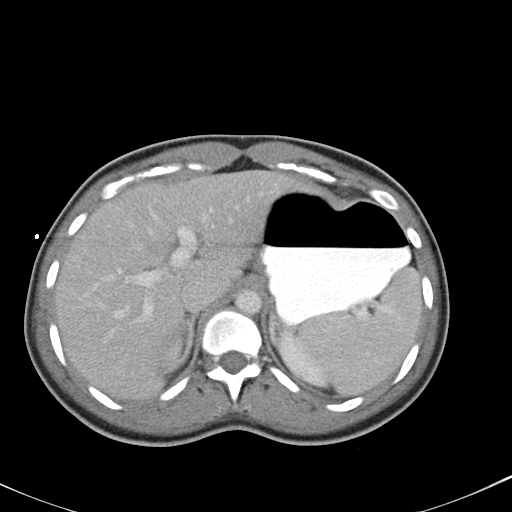
[im 71/82  soft-tissue]
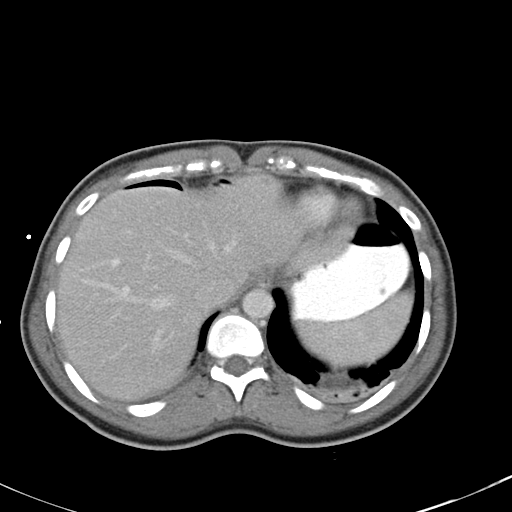
[im 76/82  soft-tissue]
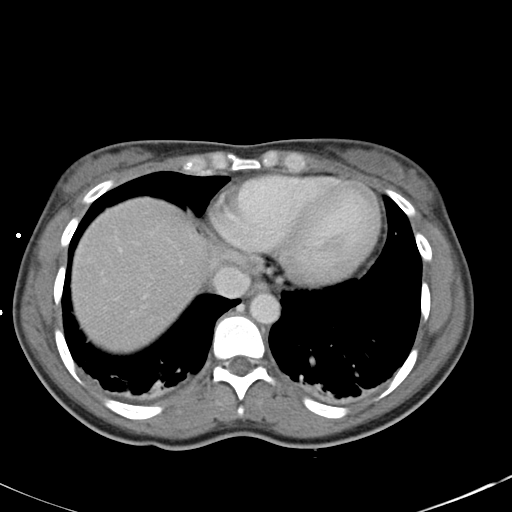

[Series 602: <mpr thick range> · coronal · 0.80mm/px · 3 of 94 slices shown]
[im 32/94  soft-tissue]
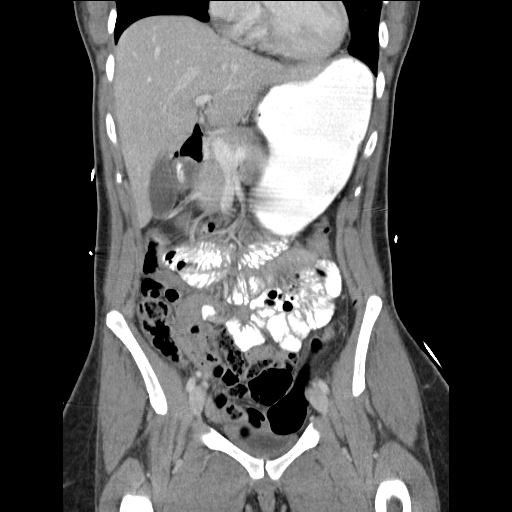
[im 42/94  soft-tissue]
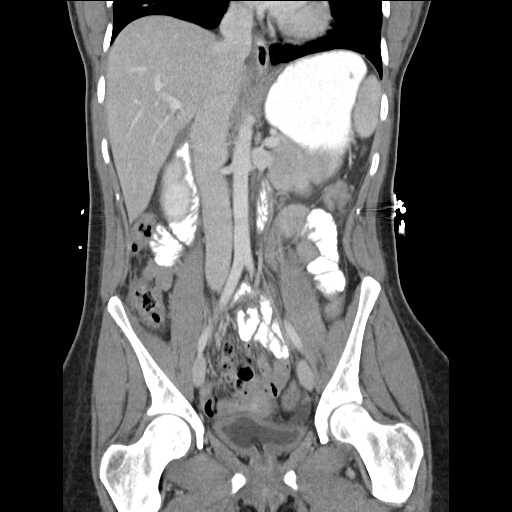
[im 52/94  soft-tissue]
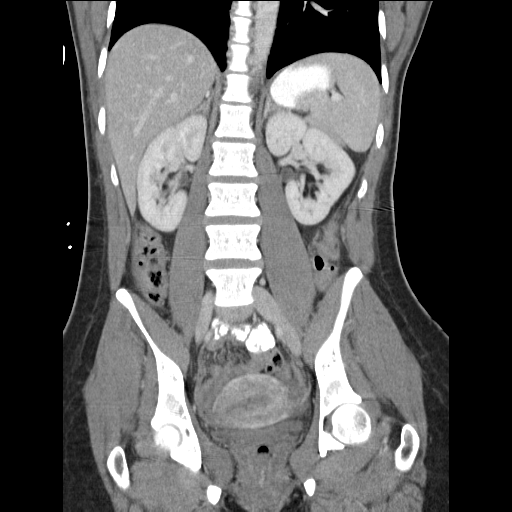

[16 of 46 positions shown; findings below may reference images not displayed]

FINDINGS: Lower chest: Dependent atelectasis at the lung bases. Trace
bilateral pleural fluid.

Liver: Normal.  No focal lesion.

Hepatobiliary: Gallbladder physiologically distended, no calcified
stone. No biliary dilatation.

Pancreas: No ductal dilatation or inflammation.

Spleen: Normal.  No evidence of splenic injury.  No splenomegaly.

Adrenal glands: Normal.

Kidneys: Symmetric renal enhancement and excretion. No
hydronephrosis. No focal lesion.

Stomach/Bowel: Stomach is distended with ingested contrast. No
gastric wall thickening. There are no dilated or thickened small
bowel loops. Suboptimal evaluation of pelvic bowel loops given lack
contrast and paucity of intra-abdominal fat. Small volume of stool
throughout the colon without colonic wall thickening. The appendix
is not definitively visualized.

Vascular/Lymphatic: No retroperitoneal adenopathy. The IVC is
intact. Abdominal aorta is normal in caliber.

Reproductive: Uterus appears normal. Previous right ovarian cyst is
not seen, patient is post drainage of cyst. Left ovary is not seen.

Bladder: Decompressed by Foley catheter.

Other: Recent postsurgical change includes free intraperitoneal air.
This is a normal finding post recent laparoscopy. Minimal free fluid
in the pelvis. No large volume pneumoperitoneum. No active
extravasation or evidence of active bleeding at this time. No
significant mesenteric free fluid. Postsurgical change in the
anterior abdominal wall.

Musculoskeletal: There are no acute or suspicious osseous
abnormalities.
IMPRESSION: Expected appearance of the abdomen and pelvis post laparoscopy.
Source of hemoperitoneum is not identified. No evidence of active
bleeding or a current hemoperitoneum.

## 2017-08-24 ENCOUNTER — Telehealth: Payer: Self-pay | Admitting: Obstetrics and Gynecology

## 2017-08-24 ENCOUNTER — Encounter: Payer: Self-pay | Admitting: Obstetrics and Gynecology

## 2017-08-24 ENCOUNTER — Ambulatory Visit (INDEPENDENT_AMBULATORY_CARE_PROVIDER_SITE_OTHER): Payer: BLUE CROSS/BLUE SHIELD | Admitting: Obstetrics and Gynecology

## 2017-08-24 ENCOUNTER — Other Ambulatory Visit: Payer: Self-pay

## 2017-08-24 VITALS — BP 116/62 | HR 80 | Resp 14 | Ht 60.0 in | Wt 122.0 lb

## 2017-08-24 DIAGNOSIS — Z0000 Encounter for general adult medical examination without abnormal findings: Secondary | ICD-10-CM | POA: Diagnosis not present

## 2017-08-24 DIAGNOSIS — N63 Unspecified lump in unspecified breast: Secondary | ICD-10-CM | POA: Diagnosis not present

## 2017-08-24 DIAGNOSIS — Z01419 Encounter for gynecological examination (general) (routine) without abnormal findings: Secondary | ICD-10-CM | POA: Diagnosis not present

## 2017-08-24 DIAGNOSIS — N898 Other specified noninflammatory disorders of vagina: Secondary | ICD-10-CM

## 2017-08-24 DIAGNOSIS — Z113 Encounter for screening for infections with a predominantly sexual mode of transmission: Secondary | ICD-10-CM

## 2017-08-24 NOTE — Telephone Encounter (Signed)
Patient used the [facility] at: [address]

## 2017-08-24 NOTE — Telephone Encounter (Signed)
Updated. -eh

## 2017-08-24 NOTE — Progress Notes (Signed)
27 y.o. G0P0000 Single IndianF here for annual exam.   She gets a pain in her right lateral abdomen/hip only when she bends over. It occurs intermittently for the last few months. Only occurs when she bends over pinches for few minutes. Cycles q month x 5-6 days. Saturates a pad in 3-4 hours. No BTB. Cramps are moderate, helped with Aleve and heating pad. Sexually active, same partner x 2-3 months. Intermittent using condoms.  She has a one month h/o an increase in vaginal d/c with odor.  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  The patient is overdue for f/u right breast ultrasound (she was out of insurance). No pain in her breast.     Patient's last menstrual period was 08/06/2017.          Sexually active: Yes.    The current method of family planning is condoms most of the time.  Exercising: No.  The patient does not participate in regular exercise at present. Smoker:  no  Health Maintenance: Pap:  11-11-15 WNL  History of abnormal Pap:  no MMG:  Right breast U/S -recommended 6 month F/U - patient did not follow up  TDaP:  Up to date Gardasil: completed 1    reports that  has never smoked. she has never used smokeless tobacco. She reports that she does not drink alcohol or use drugs. She is an [employer] fundraiser.   Past Medical History:  Diagnosis Date  . Medical history non-contributory     Past Surgical History:  Procedure Laterality Date  . LAPAROSCOPY N/A 10/20/2015   Procedure: LAPAROSCOPY DIAGNOSTIC, DRAINAGE OF RIGHT OVARIAN CYST;  Surgeon: Romualdo Bolk, MD;  Location: [facility];  Service: Gynecology;  Laterality: N/A;  . NO PAST SURGERIES    . PELVIC LAPAROSCOPY    H/O spontaneous hemoperitoneum, no source found.   No current outpatient medications on file.   No current facility-administered medications for this visit.     Family History  Problem Relation Age of Onset  . Hypertension Father     Review of Systems  Constitutional: Negative.   HENT:  Negative.   Eyes: Negative.   Respiratory: Negative.   Cardiovascular: Negative.   Gastrointestinal: Negative.   Endocrine: Negative.   Genitourinary: Negative.   Musculoskeletal: Positive for back pain.       RL back pain   Skin: Negative.   Allergic/Immunologic: Negative.   Neurological: Negative.   Psychiatric/Behavioral: Negative.     Exam:   BP 116/62 (BP Location: Right Arm, Patient Position: Sitting, Cuff Size: Normal)   Pulse 80   Resp 14   Ht 5' (1.524 m)   Wt 122 lb (55.3 kg)   LMP 08/06/2017   BMI 23.83 kg/m   Weight change: @WEIGHTCHANGE @ Height:   Height: 5' (152.4 cm)  Ht Readings from Last 3 Encounters:  08/24/17 5' (1.524 m)  01/30/17 5' (1.524 m)  07/14/16 5' (1.524 m)    General appearance: alert, cooperative and appears stated age Head: Normocephalic, without obvious abnormality, atraumatic Neck: no adenopathy, supple, symmetrical, trachea midline and thyroid normal to inspection and palpation Lungs: clear to auscultation bilaterally Cardiovascular: regular rate and rhythm Breasts: normal appearance, no masses or tenderness Abdomen: soft, non-tender; non distended,  no masses,  no organomegaly Extremities: extremities normal, atraumatic, no cyanosis or edema Skin: Skin color, texture, turgor normal. No rashes or lesions Lymph nodes: Cervical, supraclavicular, and axillary nodes normal. No abnormal inguinal nodes palpated Neurologic: Grossly normal  Pelvic: External genitalia:  no lesions              Urethra:  normal appearing urethra with no masses, tenderness or lesions              Bartholins and Skenes: normal                 Vagina: normal appearing vagina with an increase in watery, white vaginal discharge              Cervix: no lesions               Bimanual Exam:  Uterus:  normal size, contour, position, consistency, mobility, non-tender              Adnexa: no mass, fullness, tenderness               Rectovaginal: Confirms                Anus:  normal sphincter tone, no lesions  Chaperone was present for exam.  A:  Well Woman with normal exam  Contraception, discussed her options. Trying to stop being sexually active until marriage.   Pain in her right side/hip, will refer to primary  Vaginal discharge with odor  H/O right breast lump, not felt today, but she is overdue for f/u ultrasound  P:   Information on the paragard IUD given (she doesn't want hormone and wants to have her cycles)  Pap next year  STD testing  Screening labs  Discussed breast self exam  Discussed calcium and vit D intake  Affirm sent  F/u right breast ultrasound

## 2017-08-24 NOTE — Patient Instructions (Signed)

## 2017-08-24 NOTE — Progress Notes (Signed)
Scheduled patient while in office [facility]/2017 at 12:20 pm. Patient is agreeable to date and time.

## 2017-08-25 LAB — LIPID PANEL
CHOL/HDL RATIO: 2 ratio (ref 0.0–4.4)
Cholesterol, Total: 185 mg/dL (ref 100–199)
HDL: 93 mg/dL (ref >39)
LDL Calculated: 85 mg/dL (ref 0–99)
Triglycerides: 37 mg/dL (ref 0–149)
VLDL Cholesterol Cal: 7 mg/dL (ref 5–40)

## 2017-08-25 LAB — COMPREHENSIVE METABOLIC PANEL
A/G RATIO: 1.5 (ref 1.2–2.2)
ALT: 13 IU/L (ref 0–32)
AST: 15 IU/L (ref 0–40)
Albumin: 4.7 g/dL (ref 3.5–5.5)
Alkaline Phosphatase: 60 IU/L (ref 39–117)
BILIRUBIN TOTAL: 0.4 mg/dL (ref 0.0–1.2)
BUN/Creatinine Ratio: 17 (ref 9–23)
BUN: 10 mg/dL (ref 6–20)
CALCIUM: 9.4 mg/dL (ref 8.7–10.2)
CO2: 23 mmol/L (ref 20–29)
Chloride: 103 mmol/L (ref 96–106)
Creatinine, Ser: 0.6 mg/dL (ref 0.57–1.00)
GFR calc Af Amer: 144 mL/min/{1.73_m2} (ref >59)
GFR, EST NON AFRICAN AMERICAN: 125 mL/min/{1.73_m2} (ref >59)
Globulin, Total: 3.2 g/dL (ref 1.5–4.5)
Glucose: 99 mg/dL (ref 65–99)
POTASSIUM: 4.5 mmol/L (ref 3.5–5.2)
Sodium: 141 mmol/L (ref 134–144)
TOTAL PROTEIN: 7.9 g/dL (ref 6.0–8.5)

## 2017-08-25 LAB — HEPATITIS C ANTIBODY: Hep C Virus Ab: <0.1 s/co ratio (ref 0.0–0.9)

## 2017-08-25 LAB — CBC
HEMATOCRIT: 36.2 % (ref 34.0–46.6)
Hemoglobin: 12.1 g/dL (ref 11.1–15.9)
MCH: 28.1 pg (ref 26.6–33.0)
MCHC: 33.4 g/dL (ref 31.5–35.7)
MCV: 84 fL (ref 79–97)
Platelets: 308 10*3/uL (ref 150–379)
RBC: 4.3 x10E6/uL (ref 3.77–5.28)
RDW: 13.4 % (ref 12.3–15.4)
WBC: 6.3 10*3/uL (ref 3.4–10.8)

## 2017-08-25 LAB — HEP, RPR, HIV PANEL
HEP B S AG: NEGATIVE
HIV Screen 4th Generation wRfx: NONREACTIVE
RPR Ser Ql: NONREACTIVE

## 2017-08-25 LAB — VAGINITIS/VAGINOSIS, DNA PROBE
CANDIDA SPECIES: NEGATIVE
Gardnerella vaginalis: POSITIVE — AB
Trichomonas vaginosis: NEGATIVE

## 2017-08-25 LAB — GC/CHLAMYDIA PROBE AMP
Chlamydia trachomatis, NAA: NEGATIVE
Neisseria gonorrhoeae by PCR: NEGATIVE

## 2017-08-28 ENCOUNTER — Telehealth: Payer: Self-pay

## 2017-08-28 NOTE — Telephone Encounter (Signed)
-----   Message from Romualdo BolkJill Evelyn Jertson, MD sent at 08/26/2017  2:04 PM EST ----- Please inform the patient of her negative GC/CT when you inform her about the BV

## 2017-08-28 NOTE — Telephone Encounter (Signed)
Called patient and left message on voicemail to return my call. 

## 2017-08-29 ENCOUNTER — Other Ambulatory Visit: Payer: No Typology Code available for payment source

## 2017-08-29 MED ORDER — METRONIDAZOLE 500 MG PO TABS: 500.0000 mg | ORAL_TABLET | Freq: Two times a day (BID) | ORAL | 0 refills | Status: DC

## 2017-08-29 NOTE — Telephone Encounter (Signed)
Spoke with patient. Results given. Patient verbalizes understanding. Rx for Flagyl 500 mg po BID x 7 days #14 0RF sent to pharmacy on file. Avoid alcohol during treatment and 24 hours after completing medication. Don't mix with alcohol if mixed can cause severe nausea, vomiting and abdominal cramping.Patient verbalizes understanding. Encounter closed. 

## 2017-09-18 ENCOUNTER — Telehealth: Payer: Self-pay | Admitting: Obstetrics and Gynecology

## 2017-09-18 NOTE — Telephone Encounter (Signed)
Left message to call Kaitlyn at [phone]. 

## 2017-09-18 NOTE — Telephone Encounter (Signed)
Spoke with patient. Patient states that she took 7 day treatment of Flagyl for BV while on her menses. States 2 days after her menses ended she began having clear vaginal discharge with an odor again. "It is the same exact symptoms as before." Patient would like RN to speak with Dr.Jertson before scheduling. Aware she will likely need another OV for evaluation.

## 2017-09-18 NOTE — Telephone Encounter (Signed)
Spoke with patient. Advised of message as seen below from Dr.Jertson. Patient verbalizes understanding. Appointment scheduled for 09/20/2017 at 4:15 pm with Dr.Jertson. Patient is agreeable to date and time.  Dr.Jertson, okay for date and time?

## 2017-09-18 NOTE — Telephone Encounter (Signed)
I still feel she needs to be seen. If she has recurrent infections she could end up going on suppression for 6 months. Want to document infections prior to doing that. I also suspect that she may need a longer course of treatment, it sounds like the BV didn't go away. Please set her up to see me.

## 2017-09-18 NOTE — Telephone Encounter (Signed)
Patient called and said she was seen recently and she has some questions about Flagyl.

## 2017-09-18 NOTE — Telephone Encounter (Signed)
That is great. Thanks! 

## 2017-09-20 ENCOUNTER — Ambulatory Visit: Payer: BLUE CROSS/BLUE SHIELD | Admitting: Obstetrics and Gynecology

## 2017-09-20 ENCOUNTER — Encounter: Payer: Self-pay | Admitting: Obstetrics and Gynecology

## 2017-09-20 VITALS — BP 110/66 | HR 88 | Resp 16 | Wt 121.0 lb

## 2017-09-20 DIAGNOSIS — B3731 Acute candidiasis of vulva and vagina: Secondary | ICD-10-CM

## 2017-09-20 DIAGNOSIS — B373 Candidiasis of vulva and vagina: Secondary | ICD-10-CM

## 2017-09-20 DIAGNOSIS — N760 Acute vaginitis: Secondary | ICD-10-CM

## 2017-09-20 MED ORDER — FLUCONAZOLE 150 MG PO TABS: 150.0000 mg | ORAL_TABLET | Freq: Once | ORAL | 0 refills | Status: AC

## 2017-09-20 NOTE — Progress Notes (Signed)
GYNECOLOGY  VISIT   HPI: 27 y.o.   Single  [place]  female   [id number] with Patient's last menstrual period was 09/03/2017.   here for possible vaginitis. She was treated for BV last month.  She finished the flagyl while she was on her cycle. 2 days after her cycle ended she noticed an increase in thick vaginal d/c, slight itching. No odor.                                                                                                                                                       GYNECOLOGIC HISTORY: Patient's last menstrual period was 09/03/2017. Contraception: condoms  Menopausal hormone therapy: none        OB History    Gravida Para Term Preterm AB Living   0 0 0 0 0 0   SAB TAB Ectopic Multiple Live Births   0 0 0 0           Patient Active Problem List   Diagnosis Date Noted  . Iron deficiency anemia 12/09/2015  . B12 deficiency 12/09/2015  . 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 Medical history non-contributory     Past Surgical History:  Procedure Laterality Date  . LAPAROSCOPY N/A 10/20/2015   Procedure: LAPAROSCOPY DIAGNOSTIC, DRAINAGE OF RIGHT OVARIAN CYST;  Surgeon: Romualdo BolkJill Evelyn Jertson, MD;  Location: [facility];  Service: Gynecology;  Laterality: N/A;  . NO PAST SURGERIES    . PELVIC LAPAROSCOPY      No current outpatient medications on file.   No current facility-administered medications for this visit.      ALLERGIES: Patient has no known allergies.  Family History  Problem Relation Age of Onset  . Hypertension Father     Social History   Socioeconomic History  . Marital status: Single    Spouse name: Not on file  . Number of children: Not on file  . Years of education: Not on file  . Highest education level: Not on file  Social Needs  . Financial resource strain: Not on file  . Food insecurity - worry: Not on file  . Food insecurity - inability: Not on file  . Transportation needs - medical:  Not on file  . Transportation needs - non-medical: Not on file  Occupational History  . Not on file  Tobacco Use  . Smoking status: Never Smoker  . Smokeless tobacco: Never Used  Substance and Sexual Activity  . Alcohol use: No  . Drug use: No  . Sexual activity: Yes    Partners: Male    Birth control/protection: Condom  Other Topics Concern  . Not on file  Social History Narrative  . Not on file    Review of Systems  Genitourinary:       Abnormal  discharge   All other systems reviewed and are negative.   PHYSICAL EXAMINATION:    BP 110/66 (BP Location: Right Arm, Patient Position: Sitting, Cuff Size: Normal)   Pulse 88   Resp 16   Wt 121 lb (54.9 kg)   LMP 09/03/2017   BMI 23.63 kg/m     General appearance: alert, cooperative and appears stated age  Pelvic: External genitalia:  no lesions              Urethra:  normal appearing urethra with no masses, tenderness or lesions              Bartholins and Skenes: normal                 Vagina: erythematous appearing vagina with an increase in thick, clumpy vaginal d/c              Cervix: no lesions  Chaperone was present for exam.  Wet prep: ? clue, no trich, ++ wbc KOH: ++ yeast PH: 5   ASSESSMENT Yeast vaginitis ? Residual BV    PLAN Diflucan Affirm   An After Visit Summary was printed and given to the patient.

## 2017-09-20 NOTE — Patient Instructions (Signed)

## 2017-09-21 ENCOUNTER — Telehealth: Payer: Self-pay | Admitting: *Deleted

## 2017-09-21 LAB — VAGINITIS/VAGINOSIS, DNA PROBE
Candida Species: POSITIVE — AB
GARDNERELLA VAGINALIS: POSITIVE — AB
Trichomonas vaginosis: NEGATIVE

## 2017-09-21 MED ORDER — METRONIDAZOLE 0.75 % VA GEL: 1.0000 | Freq: Every day | VAGINAL | 0 refills | Status: AC

## 2017-09-21 NOTE — Telephone Encounter (Signed)
Spoke with patient, advised as seen below per Dr. Oscar LaJertson. Patient request to try metrogel, Rx to verified pharmacy on file.   Routing to provider for final review.  Will close encounter.

## 2017-09-21 NOTE — Telephone Encounter (Signed)
-----   Message from Romualdo BolkJill Evelyn Jertson, MD sent at 09/21/2017 12:28 PM EDT ----- The patient was started on diflucan yesterday, but please let her know that the affirm is + for BV (I warned her it might be). Please treat with flagyl (either oral or vaginal, her choice), no ETOH while on Flagyl.  Oral: Flagyl 500 mg BID x 7 days, or Vaginal: Metrogel, 1 applicator per vagina q day x 5 days.

## 2017-09-21 NOTE — Telephone Encounter (Signed)
Notes recorded by Leda MinHamm, Guynell Kleiber N, RN on 09/21/2017 at 2:40 PM EDT Left message to call Noreene LarssonJill at [phone].

## 2017-09-22 MED ORDER — METRONIDAZOLE 500 MG PO TABS: 500.0000 mg | ORAL_TABLET | Freq: Two times a day (BID) | ORAL | 0 refills | Status: AC

## 2017-09-22 NOTE — Telephone Encounter (Signed)
Patient is being treated for BV. Please see telephone encounter dated 3/21. Left detailed message at number provided [phone], advised rx for Flagyl 500 mg po BID x 7 days #14 0RF has been sent to pharmacy on file. Avoid alcohol during treatment and 24 hours after completing medication. Don't mix with alcohol if mixed can cause severe nausea, vomiting and abdominal cramping.Advised to take with meals and to return call with any additional questions.  Routing to covering provider for final review. Patient agreeable to disposition. Will close encounter.

## 2017-09-22 NOTE — Telephone Encounter (Signed)
Patient called and requested the pill form of Flagyl due to lower out of pocket cost. Pharmacy on file confirmed.

## 2018-09-12 ENCOUNTER — Ambulatory Visit: Payer: BLUE CROSS/BLUE SHIELD | Admitting: Obstetrics and Gynecology
